# Patient Record
Sex: Female | Born: 1975 | Race: Black or African American | Hispanic: No | Marital: Married | Smoking: Never smoker
Health system: Southern US, Community
[De-identification: ages and names within clinical notes are randomized; demographics above are authoritative.]

## PROBLEM LIST (undated history)

## (undated) ENCOUNTER — Inpatient Hospital Stay (HOSPITAL_COMMUNITY): Payer: Self-pay

## (undated) DIAGNOSIS — D649 Anemia, unspecified: Secondary | ICD-10-CM

## (undated) DIAGNOSIS — O24419 Gestational diabetes mellitus in pregnancy, unspecified control: Secondary | ICD-10-CM

## (undated) DIAGNOSIS — N390 Urinary tract infection, site not specified: Secondary | ICD-10-CM

## (undated) HISTORY — DX: Anemia, unspecified: D64.9

## (undated) HISTORY — DX: Urinary tract infection, site not specified: N39.0

## (undated) HISTORY — DX: Gestational diabetes mellitus in pregnancy, unspecified control: O24.419

## (undated) HISTORY — PX: NO PAST SURGERIES: SHX2092

---

## 2007-02-08 ENCOUNTER — Emergency Department (HOSPITAL_COMMUNITY): Admission: EM | Admit: 2007-02-08 | Discharge: 2007-02-08 | Payer: Self-pay | Admitting: Emergency Medicine

## 2009-04-02 ENCOUNTER — Ambulatory Visit: Payer: Self-pay | Admitting: Obstetrics and Gynecology

## 2009-04-02 ENCOUNTER — Observation Stay (HOSPITAL_COMMUNITY): Admission: AD | Admit: 2009-04-02 | Discharge: 2009-04-03 | Payer: Self-pay | Admitting: Obstetrics and Gynecology

## 2009-04-15 ENCOUNTER — Encounter: Payer: Self-pay | Admitting: Infectious Disease

## 2009-04-15 ENCOUNTER — Ambulatory Visit: Payer: Self-pay | Admitting: Obstetrics and Gynecology

## 2009-04-15 ENCOUNTER — Encounter: Payer: Self-pay | Admitting: Advanced Practice Midwife

## 2009-04-29 ENCOUNTER — Encounter: Payer: Self-pay | Admitting: Advanced Practice Midwife

## 2009-04-29 ENCOUNTER — Encounter: Payer: Self-pay | Admitting: Infectious Diseases

## 2009-04-29 ENCOUNTER — Encounter: Payer: Self-pay | Admitting: Family

## 2009-04-29 ENCOUNTER — Ambulatory Visit: Payer: Self-pay | Admitting: Obstetrics and Gynecology

## 2009-04-29 LAB — CONVERTED CEMR LAB
Chlamydia, DNA Probe: NEGATIVE
HCT: 31.1 % — ABNORMAL LOW (ref 36.0–46.0)
MCV: 85.9 fL (ref 78.0–100.0)
Platelets: 305 10*3/uL (ref 150–400)
RBC: 3.62 M/uL — ABNORMAL LOW (ref 3.87–5.11)

## 2009-04-30 ENCOUNTER — Encounter: Payer: Self-pay | Admitting: Family

## 2009-05-04 ENCOUNTER — Ambulatory Visit: Payer: Self-pay | Admitting: Infectious Disease

## 2009-05-04 DIAGNOSIS — R5383 Other fatigue: Secondary | ICD-10-CM

## 2009-05-04 DIAGNOSIS — R42 Dizziness and giddiness: Secondary | ICD-10-CM

## 2009-05-04 DIAGNOSIS — R5381 Other malaise: Secondary | ICD-10-CM | POA: Insufficient documentation

## 2009-05-04 DIAGNOSIS — D509 Iron deficiency anemia, unspecified: Secondary | ICD-10-CM | POA: Insufficient documentation

## 2009-05-04 DIAGNOSIS — Z8613 Personal history of malaria: Secondary | ICD-10-CM

## 2009-05-04 DIAGNOSIS — Z9189 Other specified personal risk factors, not elsewhere classified: Secondary | ICD-10-CM | POA: Insufficient documentation

## 2009-05-04 LAB — CONVERTED CEMR LAB
Alkaline Phosphatase: 114 units/L (ref 39–117)
Basophils Absolute: 0 10*3/uL (ref 0.0–0.1)
Basophils Relative: 0 % (ref 0–1)
Bilirubin Urine: NEGATIVE
CMV IgM: <8 (ref <30.0)
CRP: 0.5 mg/dL (ref <0.6)
Calcium: 9 mg/dL (ref 8.4–10.5)
EBV VCA IgG: 4.43 — ABNORMAL HIGH
EBV VCA IgM: 0.26
Eosinophils Absolute: 0.1 10*3/uL (ref 0.0–0.7)
Eosinophils Relative: 1 % (ref 0–5)
Glucose, Bld: 100 mg/dL — ABNORMAL HIGH (ref 70–99)
HCV Ab: NEGATIVE
Hemoglobin: 9.9 g/dL — ABNORMAL LOW (ref 12.0–15.0)
Hep A Total Ab: POSITIVE — AB
Hep B Core Total Ab: POSITIVE — AB
Hep B S Ab: POSITIVE — AB
Hepatitis B Surface Ag: NEGATIVE
Lymphocytes Relative: 27 % (ref 12–46)
Nitrite: NEGATIVE
Potassium: 4 meq/L (ref 3.5–5.3)
RBC / HPF: NONE SEEN (ref <3)
RBC: 3.58 M/uL — ABNORMAL LOW (ref 3.87–5.11)
RDW: 14.2 % (ref 11.5–15.5)
Sed Rate: 85 mm/hr — ABNORMAL HIGH (ref 0–22)
Sodium: 135 meq/L (ref 135–145)
Total CK: 45 units/L (ref 7–177)
WBC: 6.9 10*3/uL (ref 4.0–10.5)
pH: 6.5 (ref 5.0–8.0)

## 2009-05-05 ENCOUNTER — Encounter: Payer: Self-pay | Admitting: Infectious Disease

## 2009-05-06 ENCOUNTER — Ambulatory Visit: Payer: Self-pay | Admitting: Obstetrics and Gynecology

## 2009-05-11 ENCOUNTER — Ambulatory Visit (HOSPITAL_COMMUNITY): Admission: RE | Admit: 2009-05-11 | Discharge: 2009-05-11 | Payer: Self-pay | Admitting: Obstetrics & Gynecology

## 2009-05-29 ENCOUNTER — Ambulatory Visit: Payer: Self-pay | Admitting: Advanced Practice Midwife

## 2009-05-29 ENCOUNTER — Inpatient Hospital Stay (HOSPITAL_COMMUNITY): Admission: AD | Admit: 2009-05-29 | Discharge: 2009-06-01 | Payer: Self-pay | Admitting: Obstetrics & Gynecology

## 2009-07-04 ENCOUNTER — Inpatient Hospital Stay (HOSPITAL_COMMUNITY): Admission: AD | Admit: 2009-07-04 | Discharge: 2009-07-04 | Payer: Self-pay | Admitting: Obstetrics & Gynecology

## 2009-07-04 ENCOUNTER — Ambulatory Visit: Payer: Self-pay | Admitting: Advanced Practice Midwife

## 2010-02-23 NOTE — Miscellaneous (Signed)
Summary: HIPAA Restrictions  HIPAA Restrictions   Imported By: Florinda Marker 05/05/2009 11:53:55  _____________________________________________________________________  External Attachment:    Type:   Image     Comment:   External Document

## 2010-02-23 NOTE — Assessment & Plan Note (Signed)
Summary: nw pt 36 wks prego malaria   Visit Type:  Consult Referring Provider:  Argentina Donovan Primary Provider:  Argentina Donovan  CC:  pain in bottom of stomach and radiates to the leg on the right side and dizzines.  History of Present Illness: Note ALL history was provided by the husband. [name] who is 36 weeks pregnan and [facility] by Dr. Okey Dupre. Patient was diagnosed with malaria 2006 and apparenly lost baby with which she was pregnant at theat time. She was treated with medicines without recurrence. From discussion today this appeared to have been the only episode but the notes from Dr Okey Dupre indicate she also had malaria in 1997. During this (her 7th) pregnancy the patient at times feels  weak and having difficulty moving. She feels like she is having a fever. She feels dizziness. She has back and stomach pain. No nausea or vomiting. She has had these symptoms for one week at least. When checked in Dr Erich Montane office she had no fever measured, her urine was clean and negative cultures taken, negative gc, chlamydiaphone [phone] (cell husband)  Past History:  Past Medical History: 6 pregnancies Malaria in 1997 and 2006 Iron Deficiency anemia  Past Surgical History: Female circumcision  Review of Systems       The patient complains of fever and abdominal pain.  The patient denies anorexia, weight loss, weight gain, vision loss, decreased hearing, hoarseness, chest pain, syncope, dyspnea on exertion, peripheral edema, prolonged cough, hemoptysis, melena, hematochezia, severe indigestion/heartburn, hematuria, incontinence, genital sores, muscle weakness, suspicious skin lesions, transient blindness, depression, unusual weight change, and abnormal bleeding.    Vital Signs:  Patient profile:   35 year old female Height:      63 inches (160.02 cm) Weight:      153.75 pounds (69.89 kg) BMI:     27.33 Temp:     98.3 degrees F (36.83 degrees C) oral Pulse  rate:   92 / minute Pulse (ortho):   105 / minute BP sitting:   108 / 74  (left arm) BP standing:   109 / 78  Vitals Entered By: Starleen Arms CMA (May 04, 2009 11:49 AM)  Serial Vital Signs/Assessments:  Time      Position  BP       Pulse  Resp  Temp     By 1:02 PM   Lying LA  101/71   98                    Tamika Yarborough CMA 1:02 PM   Sitting   108/75   97                    Tamika Yarborough CMA 1:02 PM   Standing  109/78   105                   Tamika Yarborough CMA  CC: pain in bottom of stomach and radiates to the leg on the right side, dizzines Is Patient Diabetic? No Pain Assessment Patient in pain? yes     Location: left side Intensity: 7 Type: aching Nutritional Status BMI of 25 - 29 = overweight Nutritional Status Detail nl  Does patient need assistance? Functional Status Self care Ambulation Normal   Physical Exam  General:  alert, well-nourished, and well-hydrated.   Head:  normocephalic, atraumatic, and no abnormalities observed.   Eyes:  vision grossly intact, pupils equal, and  pupils round.   Ears:  no external deformities.   Nose:  no external deformity and no external erythema.   Mouth:  pharynx pink and moist and no erythema.   Neck:  supple and full ROM.   Lungs:  normal respiratory effort, no crackles, and no wheezes.   Heart:  normal rate, regular rhythm, no murmur, no gallop, and no rub.   Abdomen:  soft.  gravid, slight pain in her lower quadrants which she showed me on the left side, positive bowel sounds Msk:  normal ROM.   Neurologic:  alert & oriented X3, strength normal in all extremities, and gait normal.   Skin:  no rashes on visible skin Cervical Nodes:  no anterior cervical adenopathy and no posterior cervical adenopathy.   Axillary Nodes:  no R axillary adenopathy and no L axillary adenopathy.   Psych:  Oriented X3, subdued, and withdrawn.     Impression & Recommendations:  Problem # 1:  FEVER, HX OF (ICD-V15.9) No fever  documented by thermometer at home. Not clear to me why she would have malaria now? I will check a malaria smear, but will work her up for other causes of fever including blood cultures x 2, repeat ua and cultures,  hiv ab rna, viral hepatides, cmv, ebv serologies, ana, esr, cpk, CRP, ACE level. She should measure her temperature at home to document fevers. She should be seen after delivering her baby. One complicating factor is that the pt does not want to be examined by a female MD and we have no female MDs, here other than Dr. Philipp Deputy and I do not think she is seeing non HIV pts in this clinic, though I can ask her if she can see this pt. Alternatively I could see the pt and not perform an exam or I could coordinate with her ob/gyn, or she could go to [place] or [place], [place] for female ID MD> Orders: T-Hepatitis A Antibody [phone]) T-Hepatitis B Core Antibody ([id number]) T-Hepatitis B Surface Antibody (30865-78469) T-Hepatitis B Surface Antigen [phone]) T-Epstein-Barr virus, viral capsid [phone]) T-Epstein-Barr virus, nuclear antigen [phone]) T-Epstein-Barr virus, early antigen (59563-87564) T-CK Total [phone]) T-Antinuclear Antib (ANA) 352 257 0450) T-C-Reactive Protein [phone]) T-Sed Rate (Automated) (20254-27062) T-HIV Viral Load (37628-31517) T-HIV Antibody  (Reflex) (61607-37106) T- * Misc. Laboratory test [phone]) T-Culture, Blood Routine [phone]) T-Culture, Blood Routine ([id number]) T-Urinalysis (16967-89381) T-Culture, Urine (01751-02585) Consultation Level IV (27782)  Problem # 2:  MALARIA, HX OF (ICD-V12.03) Doubt she has recurrence and curious why malaria smear was not already ordered at womens if this was of genuine concern at the time Orders: T-Malaria Smear (42353-61443) T-Comprehensive Metabolic Panel [id number]) T-CBC w/Diff [phone]) T-Hepatitis C Antibody (45809-98338) T-CMV IgG Antibody (25053-97673) T-CMV IgM  Antibody  ([address]) Consultation Level IV (09735)  Problem # 3:  IRON DEFICIENCY (ICD-280.9)  cbc being checked again  Orders: Consultation Level IV (32992)  Problem # 4:  DIZZINESS (ICD-780.4)  likely due to volume distribution in pregnancy plus anemia possibly, Check orthostatics  Orders: Consultation Level IV (42683)  Patient Instructions: 1)  We will place a PPD today 2)  Please return to clinic in 2 days for reading of this test 3)  We will do other blood work today 4)  Please rtc in one month

## 2010-02-23 NOTE — Consult Note (Signed)
Summary: [facility]: [facility]  [facility]   Imported By: Florinda Marker 05/11/2009 15:55:14  _____________________________________________________________________  External Attachment:    Type:   Image     Comment:   External Document

## 2010-04-13 LAB — GLUCOSE, CAPILLARY
Glucose-Capillary: 77 mg/dL (ref 70–99)
Glucose-Capillary: 90 mg/dL (ref 70–99)

## 2010-04-13 LAB — CBC
Hemoglobin: 9.9 g/dL — ABNORMAL LOW (ref 12.0–15.0)
Platelets: 233 10*3/uL (ref 150–400)

## 2010-04-13 LAB — RPR: RPR Ser Ql: NONREACTIVE

## 2010-04-14 LAB — POCT URINALYSIS DIP (DEVICE)
Glucose, UA: NEGATIVE mg/dL
Hgb urine dipstick: NEGATIVE
Protein, ur: NEGATIVE mg/dL
Specific Gravity, Urine: >=1.03 (ref 1.005–1.030)

## 2010-04-19 LAB — POCT URINALYSIS DIP (DEVICE)
Bilirubin Urine: NEGATIVE
Hgb urine dipstick: NEGATIVE
Ketones, ur: NEGATIVE mg/dL
Protein, ur: NEGATIVE mg/dL
pH: 5.5 (ref 5.0–8.0)

## 2010-04-19 LAB — DIFFERENTIAL
Basophils Absolute: 0.1 10*3/uL (ref 0.0–0.1)
Eosinophils Relative: 1 % (ref 0–5)
Lymphs Abs: 2.2 10*3/uL (ref 0.7–4.0)
Monocytes Relative: 4 % (ref 3–12)
Neutrophils Relative %: 66 % (ref 43–77)

## 2010-04-19 LAB — TYPE AND SCREEN: ABO/RH(D): O POS

## 2010-04-19 LAB — CBC
MCHC: 32.7 g/dL (ref 30.0–36.0)
MCV: 88.8 fL (ref 78.0–100.0)
RBC: 3.58 MIL/uL — ABNORMAL LOW (ref 3.87–5.11)
RDW: 13.9 % (ref 11.5–15.5)
WBC: 8 10*3/uL (ref 4.0–10.5)

## 2010-04-19 LAB — GC/CHLAMYDIA PROBE AMP, GENITAL: Chlamydia, DNA Probe: NEGATIVE

## 2010-04-19 LAB — WET PREP, GENITAL: Clue Cells Wet Prep HPF POC: NONE SEEN

## 2010-04-19 LAB — ABO/RH: ABO/RH(D): O POS

## 2010-04-19 LAB — SICKLE CELL SCREEN: Sickle Cell Screen: NEGATIVE

## 2010-10-15 LAB — CBC
Hemoglobin: 11.9 — ABNORMAL LOW
MCHC: 34.4
RBC: 4.21
WBC: 7.5

## 2010-10-15 LAB — URINALYSIS, ROUTINE W REFLEX MICROSCOPIC
Bilirubin Urine: NEGATIVE
Glucose, UA: NEGATIVE
Hgb urine dipstick: NEGATIVE
Specific Gravity, Urine: 1.013
pH: 5.5

## 2010-10-15 LAB — COMPREHENSIVE METABOLIC PANEL
ALT: 89 — ABNORMAL HIGH
AST: 50 — ABNORMAL HIGH
Alkaline Phosphatase: 120 — ABNORMAL HIGH
CO2: 29
Calcium: 9.8
GFR calc Af Amer: >60
GFR calc non Af Amer: >60
Glucose, Bld: 93
Potassium: 4.3
Sodium: 139

## 2010-10-15 LAB — DIFFERENTIAL
Basophils Relative: 0
Eosinophils Absolute: 0
Eosinophils Relative: 1
Lymphs Abs: 2.1
Monocytes Relative: 4

## 2010-10-15 LAB — LIPASE, BLOOD: Lipase: 32

## 2010-10-15 LAB — URINE MICROSCOPIC-ADD ON

## 2011-10-05 IMAGING — US US OB COMP +14 WK
1 series · 14 of 28 positions shown · non-contrast
Comparison: none

OBSTETRICAL ULTRASOUND:
 This ultrasound exam was performed in the [HOSPITAL] Ultrasound Department.  The OB US report was generated in the AS system, and faxed to the ordering physician.  This report is also available in [HOSPITAL]?s Access[employer] and in [REDACTED] PACS.

[Series 1: us ob comp +14 wk · 0.30mm/px · 54 acquisitions, 14 frames shown]
[im 2/54]
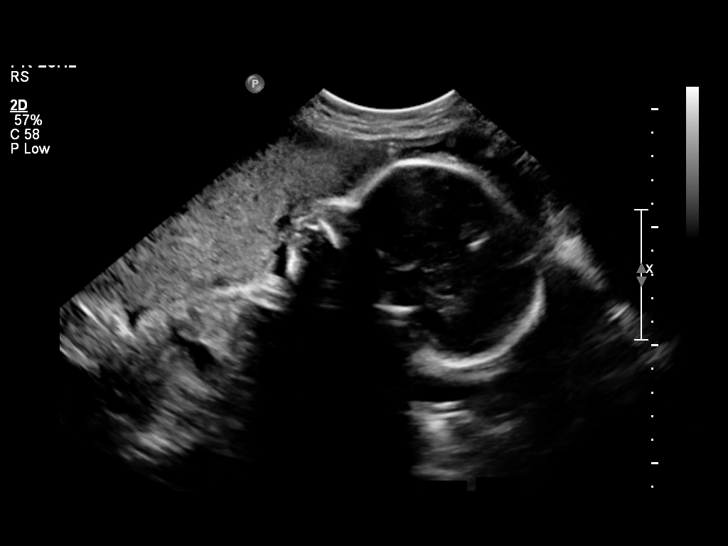
[im 6/54]
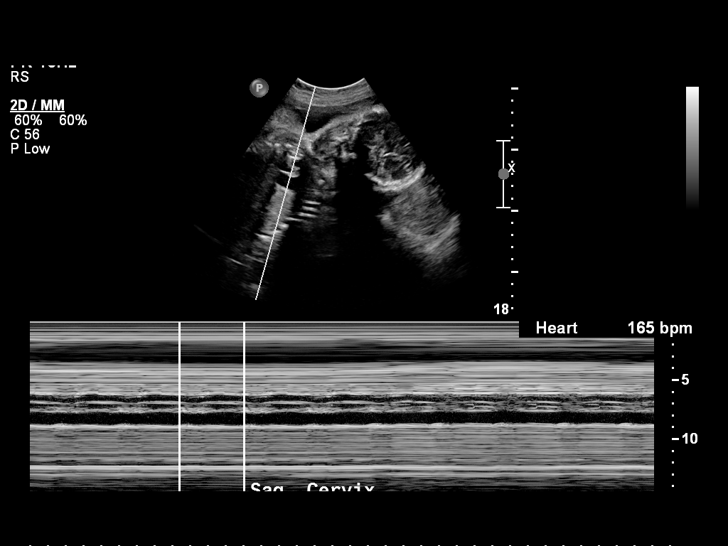
[im 10/54]
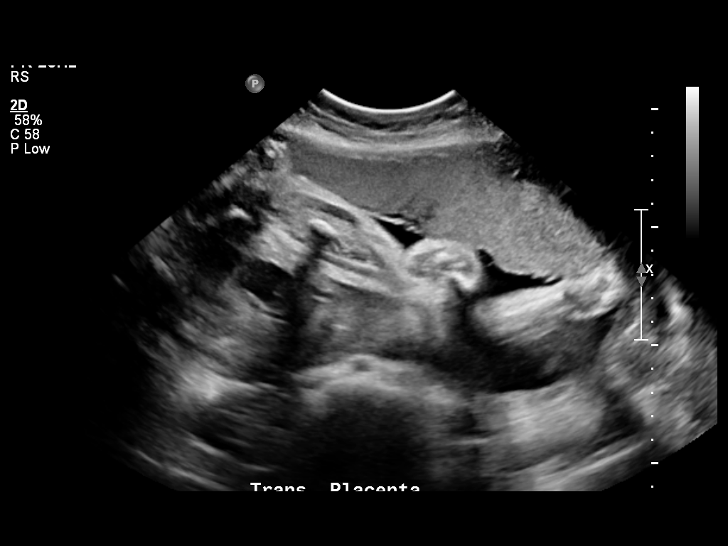
[im 14/54]
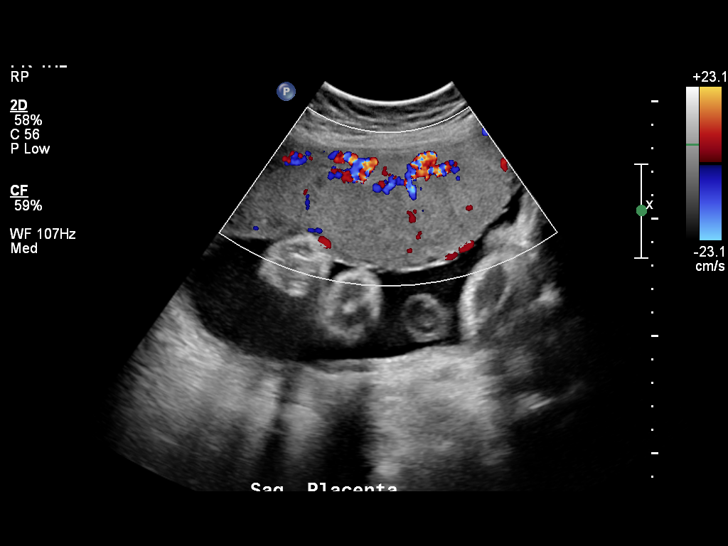
[im 18/54]
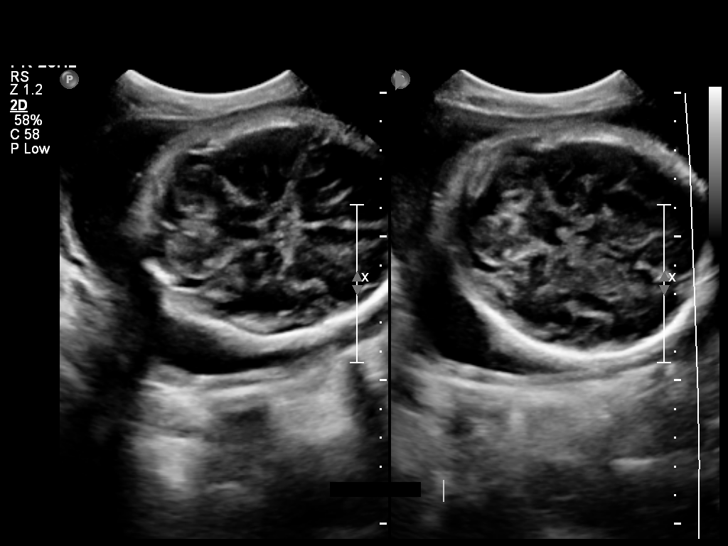
[im 22/54]
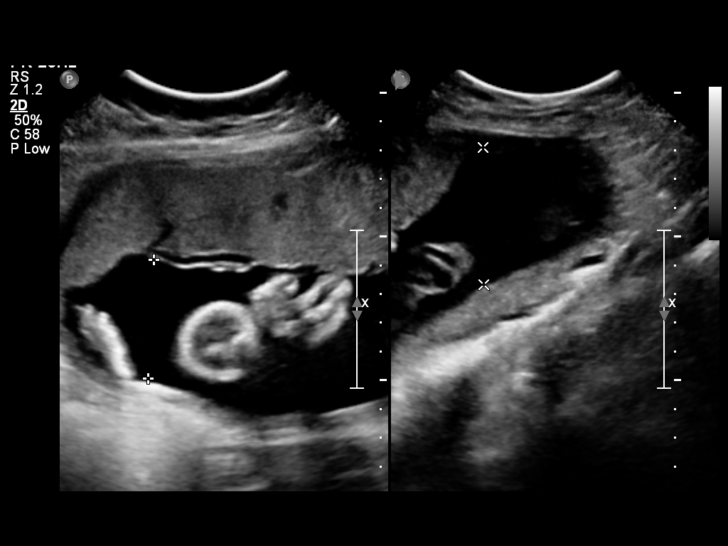
[im 26/54]
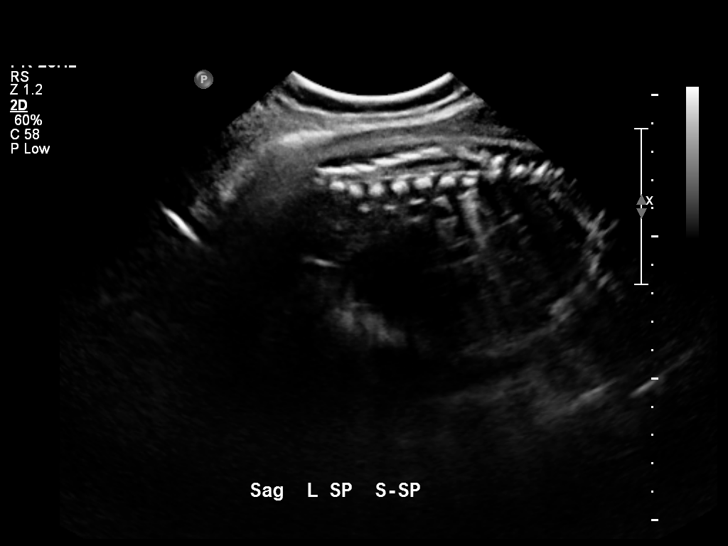
[im 30/54]
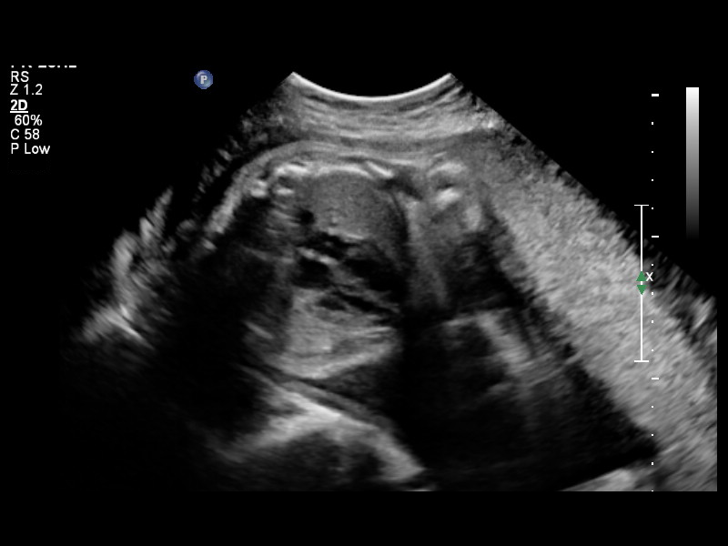
[im 34/54]
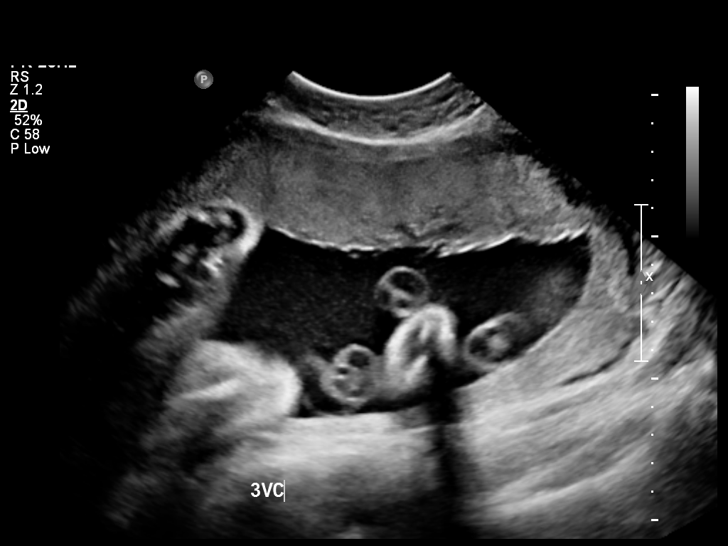
[im 38/54]
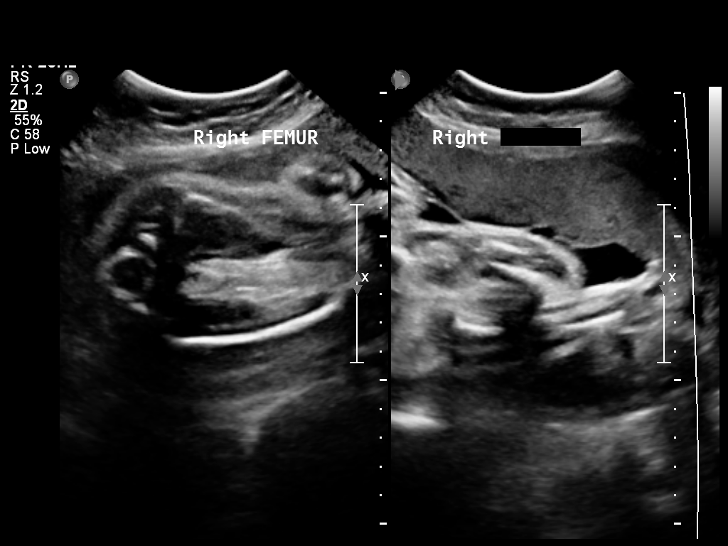
[im 42/54]
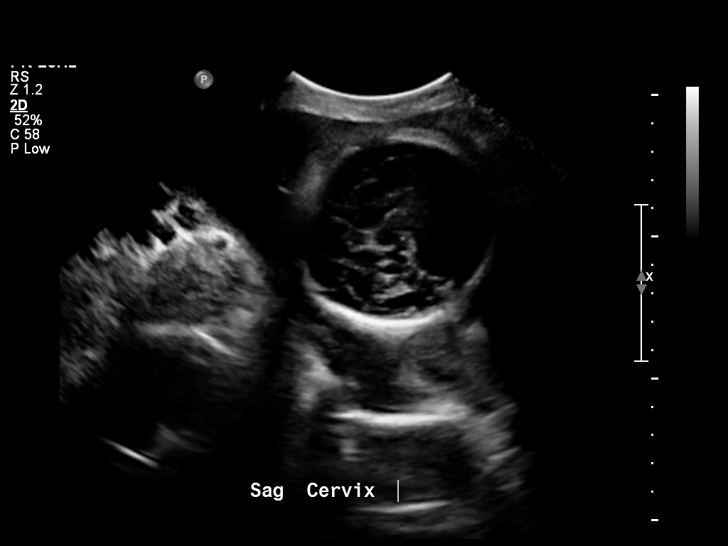
[im 46/54]
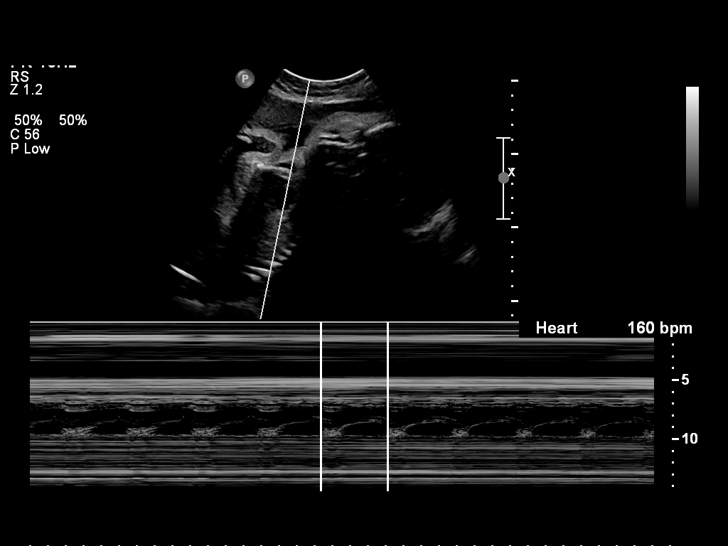
[im 50/54]
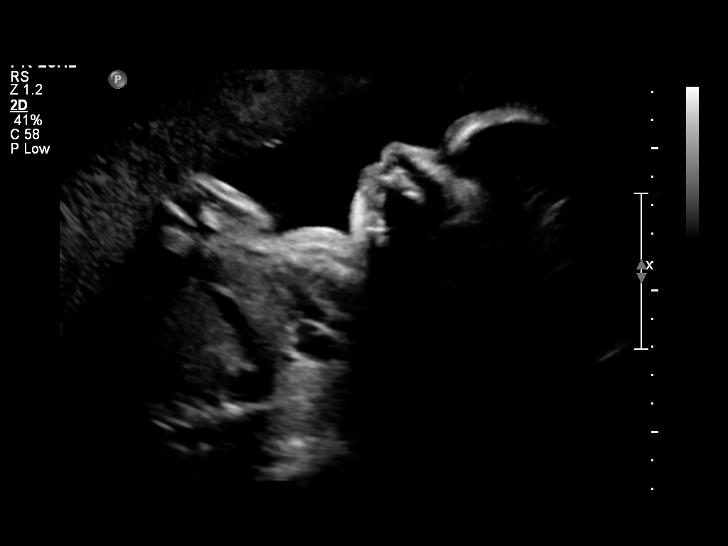
[im 54/54]
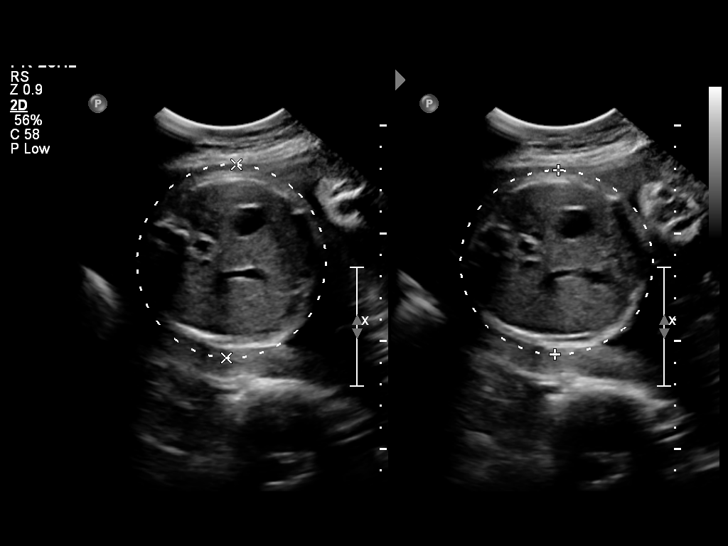

[14 of 28 positions shown; findings below may reference images not displayed]

IMPRESSION: See AS Obstetric US report.

## 2014-01-19 ENCOUNTER — Encounter (HOSPITAL_COMMUNITY): Payer: Self-pay | Admitting: *Deleted

## 2014-01-19 ENCOUNTER — Inpatient Hospital Stay (HOSPITAL_COMMUNITY)
Admission: AD | Admit: 2014-01-19 | Discharge: 2014-01-19 | Disposition: A | Discharge status: Home / Self Care | Payer: No Typology Code available for payment source | Source: Ambulatory Visit | Attending: Obstetrics and Gynecology | Admitting: Obstetrics and Gynecology

## 2014-01-19 DIAGNOSIS — O9989 Other specified diseases and conditions complicating pregnancy, childbirth and the puerperium: Secondary | ICD-10-CM | POA: Diagnosis not present

## 2014-01-19 DIAGNOSIS — S76011A Strain of muscle, fascia and tendon of right hip, initial encounter: Secondary | ICD-10-CM | POA: Diagnosis not present

## 2014-01-19 DIAGNOSIS — M545 Low back pain: Secondary | ICD-10-CM | POA: Diagnosis not present

## 2014-01-19 DIAGNOSIS — W182XXA Fall in (into) shower or empty bathtub, initial encounter: Secondary | ICD-10-CM | POA: Diagnosis not present

## 2014-01-19 DIAGNOSIS — R109 Unspecified abdominal pain: Secondary | ICD-10-CM | POA: Insufficient documentation

## 2014-01-19 LAB — URINALYSIS, ROUTINE W REFLEX MICROSCOPIC
Bilirubin Urine: NEGATIVE
Glucose, UA: NEGATIVE mg/dL
HGB URINE DIPSTICK: NEGATIVE
Ketones, ur: NEGATIVE mg/dL
NITRITE: NEGATIVE
Protein, ur: NEGATIVE mg/dL
SPECIFIC GRAVITY, URINE: 1.02 (ref 1.005–1.030)
UROBILINOGEN UA: 0.2 mg/dL (ref 0.0–1.0)
pH: 5.5 (ref 5.0–8.0)

## 2014-01-19 LAB — POCT PREGNANCY, URINE: PREG TEST UR: NEGATIVE

## 2014-01-19 LAB — URINE MICROSCOPIC-ADD ON

## 2014-01-19 MED ORDER — CYCLOBENZAPRINE HCL 10 MG PO TABS
10.0000 mg | ORAL_TABLET | Freq: Once | ORAL | Status: AC
  Administered 2014-01-19: 10 mg via ORAL
  Filled 2014-01-19: qty 1

## 2014-01-19 MED ORDER — CYCLOBENZAPRINE HCL 10 MG PO TABS: 10.0000 mg | ORAL_TABLET | Freq: Three times a day (TID) | ORAL | Status: DC | PRN

## 2014-01-19 NOTE — MAU Note (Addendum)
Pt states that she almost fell in the shower 4 days ago around noon. Pt was not having pain until this time. She slipped, caught herself and did not fall. Since this time she c/o lower abdominal and lower back pain. Denies bleeding.

## 2014-01-19 NOTE — Discharge Instructions (Signed)

## 2014-01-19 NOTE — MAU Provider Note (Signed)
History     CSN: [id number]  Arrival date and time: 01/19/14 1949   First Provider Initiated Contact with Patient 01/19/14 2028      Chief Complaint  Patient presents with  . Abdominal Pain   HPI  Michelle Scott is a 38 y.o. G7P7 who presents today with abdominal and back pain. She states that four days ago she slipped in the shower. She states that she did not fall or injury herself. However, the motion that she used to catch herself has lead to back and abdominal/hip pain. She states that she has taken ibuprofen, but it has not helped. She denies any nausea, vomiting, fever or urinary sx. She states that she was not dizzy when she fell, and that she fell because she had soap on her feet.   Patient was offered telephone interpreter. She declines telephone interpreter. She would like to have her husband interpret for her  History reviewed. No pertinent past medical history.  Past Surgical History  Procedure Laterality Date  . No past surgeries      No family history on file.  History  Substance Use Topics  . Smoking status: Never Smoker   . Smokeless tobacco: Never Used  . Alcohol Use: No    Allergies: No Known Allergies  Prescriptions prior to admission  Medication Sig Dispense Refill Last Dose  . ibuprofen (ADVIL,MOTRIN) 800 MG tablet Take 800 mg by mouth.       ROS Physical Exam   Blood pressure 125/69, pulse 81, temperature 98.8 F (37.1 C), temperature source Oral, resp. rate 18, height 5\' 7"  (1.702 m), weight 74.39 kg (164 lb), last menstrual period 01/05/2014.  Physical Exam  Nursing note and vitals reviewed. Constitutional: She is oriented to person, place, and time. She appears well-developed and well-nourished. No distress.  Cardiovascular: Normal rate.   Respiratory: Effort normal.  GI: Soft. There is no tenderness. There is no rebound.  Musculoskeletal:  Tnederness along the right iliac crest and the right edge of the sacrum. No visible bruises or  signs or injury.   Neurological: She is alert and oriented to person, place, and time.  Skin: Skin is warm and dry.  Psychiatric: She has a normal mood and affect.    MAU Course  Procedures  Results for orders placed or performed during the hospital encounter of 01/19/14 (from the past 24 hour(s))  Urinalysis, Routine w reflex microscopic     Status: Abnormal   Collection Time: 01/19/14  8:00 PM  Result Value Ref Range   Color, Urine YELLOW YELLOW   APPearance CLEAR CLEAR   Specific Gravity, Urine 1.020 1.005 - 1.030   pH 5.5 5.0 - 8.0   Glucose, UA NEGATIVE NEGATIVE mg/dL   Hgb urine dipstick NEGATIVE NEGATIVE   Bilirubin Urine NEGATIVE NEGATIVE   Ketones, ur NEGATIVE NEGATIVE mg/dL   Protein, ur NEGATIVE NEGATIVE mg/dL   Urobilinogen, UA 0.2 0.0 - 1.0 mg/dL   Nitrite NEGATIVE NEGATIVE   Leukocytes, UA SMALL (A) NEGATIVE  Urine microscopic-add on     Status: Abnormal   Collection Time: 01/19/14  8:00 PM  Result Value Ref Range   Squamous Epithelial / LPF MANY (A) RARE   WBC, UA 3-6 <3 WBC/hpf   RBC / HPF 0-2 <3 RBC/hpf   Bacteria, UA FEW (A) RARE  Pregnancy, urine POC     Status: None   Collection Time: 01/19/14  8:11 PM  Result Value Ref Range   Preg Test, Ur NEGATIVE NEGATIVE  2136: Patient states that her pain has improved with flexeril.   Assessment and Plan   1. Strain of muscle of right hip, initial encounter    Comfort measures reviewed FU with [facility] if pain worsens    Medication List    TAKE these medications        cyclobenzaprine 10 MG tablet  Commonly known as:  FLEXERIL  Take 1 tablet (10 mg total) by mouth 3 (three) times daily as needed for muscle spasms.     ibuprofen 800 MG tablet  Commonly known as:  ADVIL,MOTRIN  Take 800 mg by mouth.       Follow-up Information    Follow up with [facility].   Why:  If symptoms worsen   Contact information:   [address] [facility] [place] [phone]        Tawnya CrookHogan, Heather  Donovan 01/19/2014, 8:44 PM

## 2014-01-21 LAB — URINE CULTURE
Colony Count: NO GROWTH
Culture: NO GROWTH

## 2015-10-15 ENCOUNTER — Inpatient Hospital Stay (HOSPITAL_COMMUNITY)
Admission: AD | Admit: 2015-10-15 | Discharge: 2015-10-15 | Disposition: A | Discharge status: Home / Self Care | Payer: Medicaid Other | Source: Ambulatory Visit | Attending: Obstetrics & Gynecology | Admitting: Obstetrics & Gynecology

## 2015-10-15 ENCOUNTER — Inpatient Hospital Stay (HOSPITAL_COMMUNITY): Payer: Medicaid Other

## 2015-10-15 ENCOUNTER — Encounter (HOSPITAL_COMMUNITY): Payer: Self-pay | Admitting: *Deleted

## 2015-10-15 DIAGNOSIS — O09529 Supervision of elderly multigravida, unspecified trimester: Secondary | ICD-10-CM

## 2015-10-15 DIAGNOSIS — O0990 Supervision of high risk pregnancy, unspecified, unspecified trimester: Secondary | ICD-10-CM

## 2015-10-15 DIAGNOSIS — Z789 Other specified health status: Secondary | ICD-10-CM | POA: Diagnosis present

## 2015-10-15 DIAGNOSIS — Z3A01 Less than 8 weeks gestation of pregnancy: Secondary | ICD-10-CM | POA: Diagnosis not present

## 2015-10-15 DIAGNOSIS — O9989 Other specified diseases and conditions complicating pregnancy, childbirth and the puerperium: Secondary | ICD-10-CM | POA: Diagnosis not present

## 2015-10-15 DIAGNOSIS — O26899 Other specified pregnancy related conditions, unspecified trimester: Secondary | ICD-10-CM

## 2015-10-15 DIAGNOSIS — R109 Unspecified abdominal pain: Secondary | ICD-10-CM | POA: Diagnosis present

## 2015-10-15 DIAGNOSIS — Z758 Other problems related to medical facilities and other health care: Secondary | ICD-10-CM | POA: Diagnosis present

## 2015-10-15 DIAGNOSIS — O26891 Other specified pregnancy related conditions, first trimester: Secondary | ICD-10-CM | POA: Insufficient documentation

## 2015-10-15 DIAGNOSIS — N90810 Female genital mutilation status, unspecified: Secondary | ICD-10-CM | POA: Diagnosis present

## 2015-10-15 LAB — WET PREP, GENITAL
Clue Cells Wet Prep HPF POC: NONE SEEN
Sperm: NONE SEEN
TRICH WET PREP: NONE SEEN
YEAST WET PREP: NONE SEEN

## 2015-10-15 LAB — CBC
HCT: 32 % — ABNORMAL LOW (ref 36.0–46.0)
Hemoglobin: 11 g/dL — ABNORMAL LOW (ref 12.0–15.0)
MCH: 28.5 pg (ref 26.0–34.0)
MCHC: 34.4 g/dL (ref 30.0–36.0)
MCV: 82.9 fL (ref 78.0–100.0)
PLATELETS: 269 10*3/uL (ref 150–400)
RBC: 3.86 MIL/uL — ABNORMAL LOW (ref 3.87–5.11)
RDW: 13.8 % (ref 11.5–15.5)
WBC: 6.4 10*3/uL (ref 4.0–10.5)

## 2015-10-15 LAB — URINALYSIS, ROUTINE W REFLEX MICROSCOPIC
BILIRUBIN URINE: NEGATIVE
Glucose, UA: NEGATIVE mg/dL
HGB URINE DIPSTICK: NEGATIVE
Ketones, ur: NEGATIVE mg/dL
Leukocytes, UA: NEGATIVE
Nitrite: NEGATIVE
PH: 5.5 (ref 5.0–8.0)
Protein, ur: NEGATIVE mg/dL
SPECIFIC GRAVITY, URINE: 1.01 (ref 1.005–1.030)

## 2015-10-15 LAB — GC/CHLAMYDIA PROBE AMP (~~LOC~~) NOT AT ARMC
Chlamydia: NEGATIVE
Neisseria Gonorrhea: NEGATIVE

## 2015-10-15 LAB — HCG, QUANTITATIVE, PREGNANCY: HCG, BETA CHAIN, QUANT, S: 57638 m[IU]/mL — AB (ref <5)

## 2015-10-15 LAB — POCT PREGNANCY, URINE: PREG TEST UR: POSITIVE — AB

## 2015-10-15 LAB — HIV ANTIBODY (ROUTINE TESTING W REFLEX): HIV SCREEN 4TH GENERATION: NONREACTIVE

## 2015-10-15 MED ORDER — OXYCODONE-ACETAMINOPHEN 5-325 MG PO TABS
1.0000 | ORAL_TABLET | Freq: Once | ORAL | Status: AC
  Administered 2015-10-15: 1 via ORAL
  Filled 2015-10-15: qty 1

## 2015-10-15 MED ORDER — CONCEPT OB 130-92.4-1 MG PO CAPS: 1.0000 | ORAL_CAPSULE | Freq: Every day | ORAL | 12 refills | Status: DC

## 2015-10-15 MED ORDER — TRAMADOL HCL 50 MG PO TABS: 50.0000 mg | ORAL_TABLET | Freq: Four times a day (QID) | ORAL | 0 refills | Status: DC | PRN

## 2015-10-15 NOTE — Discharge Instructions (Signed)

## 2015-10-15 NOTE — MAU Provider Note (Signed)
Chief Complaint: Abdominal Cramping   First Provider Initiated Contact with Patient 10/15/15 0428     SUBJECTIVE HPI: Michelle Scott is a 40 y.o. G8P7 at 5731w5d who presents to [facility] reporting sharp low abd pain that woke her from her sleep. Pos UPT. No other testing this pregnancy.   Location: suprapubic  Quality: sharp Severity: severe Duration: <1 hour Course: unchanged Timing: constant Modifying factors: none. Hasn't tried anything for the pain.  Associated signs and symptoms: Neg for fever, chills, VB, vaginal discharge, urinary complaints. Having N/V x ~1 week. No change.   Pt has signed a form declining hospital interpreter. Husband interpreting.   History reviewed. No pertinent past medical history. OB History  Gravida Para Term Preterm AB Living  8 7       6   SAB TAB Ectopic Multiple Live Births  0 0 0   6    # Outcome Date GA Lbr Len/2nd Weight Sex Delivery Anes PTL Lv  8 Current           7 Para      Vag-Spont     6 Para      Vag-Spont     5 Para      Vag-Spont     4 Para      Vag-Spont     3 Para      Vag-Spont     2 Para      Vag-Spont     1 Para      Vag-Spont        Past Surgical History:  Procedure Laterality Date  . NO PAST SURGERIES     Social History   Social History  . Marital status: Married    Spouse name: N/A  . Number of children: N/A  . Years of education: N/A   Occupational History  . Not on file.   Social History Main Topics  . Smoking status: Never Smoker  . Smokeless tobacco: Never Used  . Alcohol use No  . Drug use: No  . Sexual activity: Yes    Birth control/ protection: None   Other Topics Concern  . Not on file   Social History Narrative  . No narrative on file   No current facility-administered medications on file prior to encounter.    Current Outpatient Prescriptions on File Prior to Encounter  Medication Sig Dispense Refill  . cyclobenzaprine (FLEXERIL) 10 MG tablet Take 1 tablet (10 mg total) by  mouth 3 (three) times daily as needed for muscle spasms. 30 tablet 0  . ibuprofen (ADVIL,MOTRIN) 800 MG tablet Take 800 mg by mouth.     No Known Allergies  I have reviewed the past Medical Hx, Surgical Hx, Social Hx, Allergies and Medications.   Review of Systems  Constitutional: Negative for chills and fever.  Gastrointestinal: Positive for abdominal pain, nausea and vomiting. Negative for constipation and diarrhea.  Genitourinary: Negative for dysuria, hematuria, vaginal bleeding and vaginal discharge.  Musculoskeletal: Negative for back pain.    OBJECTIVE Patient Vitals for the past 24 hrs:  BP Temp Temp src Pulse Resp  10/15/15 0604 104/63 - - 65 -  10/15/15 0349 116/74 97.6 F (36.4 C) Oral 73 20   Constitutional: Well-developed, well-nourished female in mild distress.  Cardiovascular: normal rate Respiratory: normal rate and effort.  GI: Abd soft, moderate SP tenderness. Pos guarding. Neg mass or rebound tenderness. Pos BS x 4 MS: Extremities nontender, no edema, normal ROM Neurologic: Alert and oriented x 4.  GU: Neg CVAT.  SPECULUM EXAM: Complete infibulation, physiologic discharge, no blood noted, cervix clean  BIMANUAL: cervix closed; uterus top- normal size, no adnexal tenderness or masses. No CMT.  LAB RESULTS Results for orders placed or performed during the hospital encounter of 10/15/15 (from the past 24 hour(s))  Urinalysis, Routine w reflex microscopic (not at [facility])     Status: None   Collection Time: 10/15/15  3:55 AM  Result Value Ref Range   Color, Urine YELLOW YELLOW   APPearance CLEAR CLEAR   Specific Gravity, Urine 1.010 1.005 - 1.030   pH 5.5 5.0 - 8.0   Glucose, UA NEGATIVE NEGATIVE mg/dL   Hgb urine dipstick NEGATIVE NEGATIVE   Bilirubin Urine NEGATIVE NEGATIVE   Ketones, ur NEGATIVE NEGATIVE mg/dL   Protein, ur NEGATIVE NEGATIVE mg/dL   Nitrite NEGATIVE NEGATIVE   Leukocytes, UA NEGATIVE NEGATIVE  Pregnancy, urine POC     Status: Abnormal    Collection Time: 10/15/15  3:57 AM  Result Value Ref Range   Preg Test, Ur POSITIVE (A) NEGATIVE  hCG, quantitative, pregnancy     Status: Abnormal   Collection Time: 10/15/15  4:18 AM  Result Value Ref Range   hCG, Beta Chain, Quant, S 57,638 (H) <5 mIU/mL  CBC     Status: Abnormal   Collection Time: 10/15/15  4:18 AM  Result Value Ref Range   WBC 6.4 4.0 - 10.5 K/uL   RBC 3.86 (L) 3.87 - 5.11 MIL/uL   Hemoglobin 11.0 (L) 12.0 - 15.0 g/dL   HCT 16.1 (L) 09.6 - 04.5 %   MCV 82.9 78.0 - 100.0 fL   MCH 28.5 26.0 - 34.0 pg   MCHC 34.4 30.0 - 36.0 g/dL   RDW 40.9 81.1 - 91.4 %   Platelets 269 150 - 400 K/uL  Wet prep, genital     Status: Abnormal   Collection Time: 10/15/15  4:30 AM  Result Value Ref Range   Yeast Wet Prep HPF POC NONE SEEN NONE SEEN   Trich, Wet Prep NONE SEEN NONE SEEN   Clue Cells Wet Prep HPF POC NONE SEEN NONE SEEN   WBC, Wet Prep HPF POC FEW (A) NONE SEEN   Sperm NONE SEEN     IMAGING US Ob Comp Less 14 Wks  Result Date: 10/15/2015 CLINICAL DATA:  Abdominal pain for 10 days. Estimated gestational age by LMP is 6 weeks 5 days. Quantitative beta HCG is pending. EXAM: OBSTETRIC <14 WK ULTRASOUND TECHNIQUE: Transabdominal ultrasound was performed for evaluation of the gestation as well as the maternal uterus and adnexal regions. COMPARISON:  None. FINDINGS: Intrauterine gestational sac: A single intrauterine gestational sac is present. Yolk sac:  Yolk sac is present. Embryo:  Fetal pole is present. Cardiac Activity: Fetal cardiac activity is observed. Heart Rate: 138 bpm CRL:   9.5  mm   6 w 6 d                  [place] EDC: 06/03/2016 Subchorionic hemorrhage:  None visualized. Maternal uterus/adnexae: Uterus is anteverted. No myometrial mass lesions demonstrated. Both ovaries are visualized and appear normal. Benign-appearing cysts on the left ovary, largest measuring 2.4 cm maximal diameter, likely functional. Small amount of free fluid in the pelvis is likely physiologic.  IMPRESSION: Single intrauterine pregnancy. Estimated gestational age by crown-rump length is 6 weeks 6 days. No acute complication demonstrated sonographically. Small amount of free fluid in the pelvis, likely physiologic. Electronically Signed   By: Chrissie Noa  Andria Meuse M.D.   On: 10/15/2015 05:21    MAU COURSE CBC, Quant, ABO/Rh, ultrasound, wet prep and GC/chlamydia culture, UA, Percocet.   MDM Pain in early pregnancy with normal intrauterine pregnancy and hemodynamically stable. Pain possibly due to ruptured cyst.   ASSESSMENT 1. Abdominal pain affecting pregnancy     PLAN Discharge home in stable condition. First trimester precautions Follow-up Information    Obstetrician of your choice .   Why:  Start prenatal care       [facility] [place] [facility] AD[facility] .   Why:  as needed in emergencies Contact information: [address] [id number] mc [place] [address] [phone]           Medication List    STOP taking these medications   cyclobenzaprine 10 MG tablet Commonly known as:  FLEXERIL   ibuprofen 800 MG tablet Commonly known as:  ADVIL,MOTRIN     TAKE these medications   CONCEPT OB 130-92.4-1 MG Caps Take 1 tablet by mouth daily.   traMADol 50 MG tablet Commonly known as:  ULTRAM Take 1-2 tablets (50-100 mg total) by mouth every 6 (six) hours as needed for severe pain.        [place], CNM 10/15/2015  5:53 AM  4

## 2015-10-15 NOTE — MAU Note (Addendum)
PT HAS ARRIVED VIA EMS.   HUSBAND  - ROBDU  SAYS ON 9-11-  THEY  WENT  TO DR   FOR ABD  PAIN-   UPT  WAS POSITIVE    .     SAYS  SHE WAS ASLEEP  - WOKE  AT 0230-   TO B-ROOM  - SHE  VOMITED  THEN ABD PAIN  BECAME  PAIN  WORSE

## 2015-11-05 LAB — OB RESULTS CONSOLE HEPATITIS B SURFACE ANTIGEN: Hepatitis B Surface Ag: NEGATIVE

## 2015-11-05 LAB — CYTOLOGY - PAP
CYSTIC FIBROSIS PROFILE: NEGATIVE
Drug Screen, Urine: NEGATIVE
GLUCOSE 1 HR PRENATAL, POC: 131 mg/dL
Pap: NEGATIVE
QUAD SCREEN FOR MFM: ABNORMAL
Urine Culture, OB: NEGATIVE

## 2015-11-05 LAB — OB RESULTS CONSOLE ABO/RH: RH TYPE: POSITIVE

## 2015-11-05 LAB — OB RESULTS CONSOLE PLATELET COUNT: Platelets: 287 10*3/uL

## 2015-11-05 LAB — OB RESULTS CONSOLE RPR: RPR: NONREACTIVE

## 2015-11-05 LAB — OB RESULTS CONSOLE GC/CHLAMYDIA
Chlamydia: NEGATIVE
GC PROBE AMP, GENITAL: NEGATIVE

## 2015-11-05 LAB — OB RESULTS CONSOLE RUBELLA ANTIBODY, IGM: RUBELLA: IMMUNE

## 2015-11-05 LAB — OB RESULTS CONSOLE HGB/HCT, BLOOD
HCT: 36 %
Hemoglobin: 11.6 g/dL

## 2015-11-05 LAB — OB RESULTS CONSOLE ANTIBODY SCREEN: Antibody Screen: NEGATIVE

## 2015-11-05 LAB — OB RESULTS CONSOLE HIV ANTIBODY (ROUTINE TESTING): HIV: NONREACTIVE

## 2015-11-05 LAB — OB RESULTS CONSOLE VARICELLA ZOSTER ANTIBODY, IGG: VARICELLA IGG: IMMUNE

## 2016-01-25 NOTE — L&D Delivery Note (Signed)
Delivery Note At 7:25 AM a viable female was delivered via Vaginal, Spontaneous Delivery (Presentation: Direct OT).  APGAR: 9, 9; weight  pending.   Placenta status: Spontaneous, intact.  Cord: 3 vessels. Infant dried and placed on pt's abd. Cord clamped and cut by SNM; hospital cord blood sample collected.  Episiotomy of infundibulum done to make room for delivery because of previous female circumcision. Patient requested repair. Repaired with 3 interrupted stitches.  Anesthesia:  1% licodaine Episiotomy: None Lacerations: None Suture Repair: 3.0 monocryl Est. Blood Loss (mL): 100  Mom to postpartum.  Baby to Couplet care / Skin to Skin.  Cleone Slim SNM 05/24/2016, 7:47 AM  Patient is a [id number] at [redacted]w[redacted]d who was admitted in active labor, significant hx of GDMA2, AMA, grandmultip.  She progressed without augmentation.  I was gloved and present for delivery in its entirety.  Second stage of labor progressed, baby delivered after a couple contractions, once she felt the urge to push.  No decels during second stage noted.  Complications: none  Lacerations: epis of infundibulum prior to delivery  EBL: 100cc  Cam Hai, CNM 9:07 AM 05/24/2016

## 2016-03-14 LAB — OB RESULTS CONSOLE HIV ANTIBODY (ROUTINE TESTING): HIV: NONREACTIVE

## 2016-03-14 LAB — OB RESULTS CONSOLE HGB/HCT, BLOOD
HCT: 29 %
Hemoglobin: 9.5 g/dL

## 2016-03-14 LAB — GLUCOSE, 1 HOUR: GLUCOSE 1 HR PRENATAL, POC: 142 mg/dL

## 2016-03-14 LAB — OB RESULTS CONSOLE RPR: RPR: NONREACTIVE

## 2016-03-18 ENCOUNTER — Encounter (HOSPITAL_COMMUNITY): Payer: Self-pay

## 2016-03-23 LAB — GLUCOSE, 3 HOUR

## 2016-03-31 ENCOUNTER — Encounter: Payer: Self-pay | Admitting: *Deleted

## 2016-04-01 ENCOUNTER — Encounter: Payer: Self-pay | Admitting: *Deleted

## 2016-04-01 DIAGNOSIS — O09529 Supervision of elderly multigravida, unspecified trimester: Secondary | ICD-10-CM

## 2016-04-01 DIAGNOSIS — O09891 Supervision of other high risk pregnancies, first trimester: Secondary | ICD-10-CM

## 2016-04-01 DIAGNOSIS — O24419 Gestational diabetes mellitus in pregnancy, unspecified control: Secondary | ICD-10-CM

## 2016-04-01 NOTE — Progress Notes (Signed)
Per health department preanatal records

## 2016-04-02 ENCOUNTER — Encounter (HOSPITAL_COMMUNITY): Payer: Self-pay | Admitting: Certified Nurse Midwife

## 2016-04-02 ENCOUNTER — Inpatient Hospital Stay (HOSPITAL_COMMUNITY)
Admission: AD | Admit: 2016-04-02 | Discharge: 2016-04-02 | Disposition: A | Discharge status: Home / Self Care | Payer: Medicaid Other | Source: Ambulatory Visit | Attending: Obstetrics & Gynecology | Admitting: Obstetrics & Gynecology

## 2016-04-02 DIAGNOSIS — Z3A31 31 weeks gestation of pregnancy: Secondary | ICD-10-CM | POA: Insufficient documentation

## 2016-04-02 DIAGNOSIS — O09523 Supervision of elderly multigravida, third trimester: Secondary | ICD-10-CM | POA: Diagnosis not present

## 2016-04-02 DIAGNOSIS — O09891 Supervision of other high risk pregnancies, first trimester: Secondary | ICD-10-CM

## 2016-04-02 DIAGNOSIS — O219 Vomiting of pregnancy, unspecified: Secondary | ICD-10-CM | POA: Diagnosis not present

## 2016-04-02 DIAGNOSIS — O212 Late vomiting of pregnancy: Secondary | ICD-10-CM | POA: Insufficient documentation

## 2016-04-02 DIAGNOSIS — D509 Iron deficiency anemia, unspecified: Secondary | ICD-10-CM | POA: Diagnosis not present

## 2016-04-02 DIAGNOSIS — R109 Unspecified abdominal pain: Secondary | ICD-10-CM | POA: Diagnosis present

## 2016-04-02 DIAGNOSIS — O9989 Other specified diseases and conditions complicating pregnancy, childbirth and the puerperium: Secondary | ICD-10-CM | POA: Diagnosis not present

## 2016-04-02 DIAGNOSIS — O09529 Supervision of elderly multigravida, unspecified trimester: Secondary | ICD-10-CM

## 2016-04-02 DIAGNOSIS — O99013 Anemia complicating pregnancy, third trimester: Secondary | ICD-10-CM | POA: Insufficient documentation

## 2016-04-02 DIAGNOSIS — D508 Other iron deficiency anemias: Secondary | ICD-10-CM

## 2016-04-02 DIAGNOSIS — N949 Unspecified condition associated with female genital organs and menstrual cycle: Secondary | ICD-10-CM

## 2016-04-02 DIAGNOSIS — O24419 Gestational diabetes mellitus in pregnancy, unspecified control: Secondary | ICD-10-CM | POA: Insufficient documentation

## 2016-04-02 LAB — COMPREHENSIVE METABOLIC PANEL
ALK PHOS: 66 U/L (ref 38–126)
ALT: 12 U/L — ABNORMAL LOW (ref 14–54)
ANION GAP: 3 — AB (ref 5–15)
AST: 20 U/L (ref 15–41)
Albumin: 2.7 g/dL — ABNORMAL LOW (ref 3.5–5.0)
BUN: 12 mg/dL (ref 6–20)
CALCIUM: 8.3 mg/dL — AB (ref 8.9–10.3)
CO2: 27 mmol/L (ref 22–32)
Chloride: 104 mmol/L (ref 101–111)
Creatinine, Ser: 0.65 mg/dL (ref 0.44–1.00)
GFR calc non Af Amer: >60 mL/min (ref >60)
Glucose, Bld: 109 mg/dL — ABNORMAL HIGH (ref 65–99)
Potassium: 3.6 mmol/L (ref 3.5–5.1)
SODIUM: 134 mmol/L — AB (ref 135–145)
TOTAL PROTEIN: 6.8 g/dL (ref 6.5–8.1)
Total Bilirubin: 0.5 mg/dL (ref 0.3–1.2)

## 2016-04-02 LAB — CBC WITH DIFFERENTIAL/PLATELET
BASOS ABS: 0 10*3/uL (ref 0.0–0.1)
BASOS PCT: 0 %
EOS ABS: 0.1 10*3/uL (ref 0.0–0.7)
Eosinophils Relative: 1 %
HCT: 28.4 % — ABNORMAL LOW (ref 36.0–46.0)
HEMOGLOBIN: 9.5 g/dL — AB (ref 12.0–15.0)
LYMPHS ABS: 2.7 10*3/uL (ref 0.7–4.0)
Lymphocytes Relative: 28 %
MCH: 28.4 pg (ref 26.0–34.0)
MCHC: 33.5 g/dL (ref 30.0–36.0)
MCV: 84.8 fL (ref 78.0–100.0)
Monocytes Absolute: 0.4 10*3/uL (ref 0.1–1.0)
Monocytes Relative: 4 %
NEUTROS PCT: 67 %
Neutro Abs: 6.3 10*3/uL (ref 1.7–7.7)
Platelets: 334 10*3/uL (ref 150–400)
RBC: 3.35 MIL/uL — AB (ref 3.87–5.11)
RDW: 14.1 % (ref 11.5–15.5)
WBC: 9.4 10*3/uL (ref 4.0–10.5)

## 2016-04-02 LAB — URINALYSIS, ROUTINE W REFLEX MICROSCOPIC
BILIRUBIN URINE: NEGATIVE
Glucose, UA: NEGATIVE mg/dL
HGB URINE DIPSTICK: NEGATIVE
Ketones, ur: NEGATIVE mg/dL
Leukocytes, UA: NEGATIVE
Nitrite: NEGATIVE
Protein, ur: NEGATIVE mg/dL
SPECIFIC GRAVITY, URINE: 1.015 (ref 1.005–1.030)
pH: 7 (ref 5.0–8.0)

## 2016-04-02 LAB — LIPASE, BLOOD: LIPASE: 32 U/L (ref 11–51)

## 2016-04-02 MED ORDER — ONDANSETRON HCL 4 MG PO TABS
4.0000 mg | ORAL_TABLET | Freq: Once | ORAL | Status: AC
  Administered 2016-04-02: 4 mg via ORAL
  Filled 2016-04-02: qty 1

## 2016-04-02 MED ORDER — FAMOTIDINE 20 MG PO TABS: 20.0000 mg | ORAL_TABLET | Freq: Two times a day (BID) | ORAL | 2 refills | Status: DC

## 2016-04-02 MED ORDER — GI COCKTAIL ~~LOC~~
30.0000 mL | Freq: Once | ORAL | Status: AC
  Administered 2016-04-02: 30 mL via ORAL
  Filled 2016-04-02: qty 30

## 2016-04-02 MED ORDER — ONDANSETRON HCL 4 MG PO TABS: 4.0000 mg | ORAL_TABLET | Freq: Three times a day (TID) | ORAL | 0 refills | Status: DC | PRN

## 2016-04-02 MED ORDER — FERROUS FUMARATE 325 (106 FE) MG PO TABS: 1.0000 | ORAL_TABLET | Freq: Every day | ORAL | 3 refills | Status: DC

## 2016-04-02 NOTE — Discharge Instructions (Signed)
Iron Deficiency Anemia, Adult Iron-deficiency anemia is when you have a low amount of red blood cells or hemoglobin. This happens because you have too little iron in your body. Hemoglobin carries oxygen to parts of the body. Anemia can cause your body to not get enough oxygen. It may or may not cause symptoms. Follow these instructions at home: Medicines   Take over-the-counter and prescription medicines only as told by your doctor. This includes iron pills (supplements) and vitamins.  If you cannot handle taking iron pills by mouth, ask your doctor about getting iron through:  A vein (intravenously).  A shot (injection) into a muscle.  Take iron pills when your stomach is empty. If you cannot handle this, take them with food.  Do not drink milk or take antacids at the same time as your iron pills.  To prevent trouble pooping (constipation), eat fiber or take medicine (stool softener) as told by your doctor. Eating and drinking   Talk with your doctor before changing the foods you eat. He or she may tell you to eat foods that have a lot of iron, such as:  Liver.  Lowfat (lean) beef.  Breads and cereals that have iron added to them (fortified breads and cereals).  Eggs.  Dried fruit.  Dark green, leafy vegetables.  Drink enough fluid to keep your pee (urine) clear or pale yellow.  Eat fresh fruits and vegetables that are high in vitamin C. They help your body to use iron. Foods with a lot of vitamin C include:  Oranges.  Peppers.  Tomatoes.  Mangoes. General instructions   Return to your normal activities as told by your doctor. Ask your doctor what activities are safe for you.  Keep yourself clean, and keep things clean around you (your surroundings). Anemia can make you get sick more easily.  Keep all follow-up visits as told by your doctor. This is important. Contact a doctor if:  You feel sick to your stomach (nauseous).  You throw up (vomit).  You feel  weak.  You are sweating for no clear reason.  You have trouble pooping, such as:  Pooping (having a bowel movement) less than 3 times a week.  Straining to poop.  Having poop that is hard, dry, or larger than normal.  Feeling full or bloated.  Pain in the lower belly.  Not feeling better after pooping. Get help right away if:  You pass out (faint). If this happens, do not drive yourself to the hospital. Call your local emergency services (911 in the [place]  You have chest pain.  You have shortness of breath that:  Is very bad.  Gets worse with physical activity.  You have a fast heartbeat.  You get light-headed when getting up from sitting or lying down. This information is not intended to replace advice given to you by your health care provider. Make sure you discuss any questions you have with your health care provider. Document Released: 02/12/2010 Document Revised: 09/30/2015 Document Reviewed: 09/30/2015 Elsevier Interactive Patient Education  2017 [employer] Inc[employer]   Nausea and Vomiting, Adult Feeling sick to your stomach (nausea) means that your stomach is upset or you feel like you have to throw up (vomit). Feeling more and more sick to your stomach can lead to throwing up. Throwing up happens when food and liquid from your stomach are thrown up and out the mouth. Throwing up can make you feel weak and cause you to get dehydrated. Dehydration can make you tired and  thirsty, make you have a dry mouth, and make it so you pee (urinate) less often. Older adults and people with other diseases or a weak defense system (immune system) are at higher risk for dehydration. If you feel sick to your stomach or if you throw up, it is important to follow instructions from your doctor about how to take care of yourself. Follow these instructions at home: Eating and drinking  Follow these instructions as told by your doctor:  Take an oral rehydration solution (ORS). This is a drink  that is sold at pharmacies and stores.  Drink clear fluids in small amounts as you are able, such as:  Water.  Ice chips.  Diluted fruit juice.  Low-calorie sports drinks.  Eat bland, easy-to-digest foods in small amounts as you are able, such as:  Bananas.  Applesauce.  Rice.  Low-fat (lean) meats.  Toast.  Crackers.  Avoid fluids that have a lot of sugar or caffeine in them.  Avoid alcohol.  Avoid spicy or fatty foods. General instructions   Drink enough fluid to keep your pee (urine) clear or pale yellow.  Wash your hands often. If you cannot use soap and water, use hand sanitizer.  Make sure that all people in your home wash their hands well and often.  Take over-the-counter and prescription medicines only as told by your doctor.  Rest at home while you get better.  Watch your condition for any changes.  Breathe slowly and deeply when you feel sick to your stomach.  Keep all follow-up visits as told by your doctor. This is important. Contact a doctor if:  You have a fever.  You cannot keep fluids down.  Your symptoms get worse.  You have new symptoms.  You feel sick to your stomach for more than two days.  You feel light-headed or dizzy.  You have a headache.  You have muscle cramps. Get help right away if:  You have pain in your chest, neck, arm, or jaw.  You feel very weak or you pass out (faint).  You throw up again and again.  You see blood in your throw-up.  Your throw-up looks like black coffee grounds.  You have bloody or black poop (stools) or poop that look like tar.  You have a very bad headache, a stiff neck, or both.  You have a rash.  You have very bad pain, cramping, or bloating in your belly (abdomen).  You have trouble breathing.  You are breathing very quickly.  Your heart is beating very quickly.  Your skin feels cold and clammy.  You feel confused.  You have pain when you pee.  You have signs of  dehydration, such as:  Dark pee, hardly any pee, or no pee.  Cracked lips.  Dry mouth.  Sunken eyes.  Sleepiness.  Weakness. These symptoms may be an emergency. Do not wait to see if the symptoms will go away. Get medical help right away. Call your local emergency services (911 in the [place] Do not drive yourself to the hospital. This information is not intended to replace advice given to you by your health care provider. Make sure you discuss any questions you have with your health care provider. Document Released: 06/29/2007 Document Revised: 07/31/2015 Document Reviewed: 09/16/2014 Elsevier Interactive Patient Education  2017 [employer] Inc. Round Ligament Pain The round ligament is a cord of muscle and tissue that helps to support the uterus. It can become a source of pain during pregnancy if it  becomes stretched or twisted as the baby grows. The pain usually begins in the second trimester of pregnancy, and it can come and go until the baby is delivered. It is not a serious problem, and it does not cause harm to the baby. Round ligament pain is usually a short, sharp, and pinching pain, but it can also be a dull, lingering, and aching pain. The pain is felt in the lower side of the abdomen or in the groin. It usually starts deep in the groin and moves up to the outside of the hip area. Pain can occur with:  A sudden change in position.  Rolling over in bed.  Coughing or sneezing.  Physical activity. Follow these instructions at home: Watch your condition for any changes. Take these steps to help with your pain:  When the pain starts, relax. Then try:  Sitting down.  Flexing your knees up to your abdomen.  Lying on your side with one pillow under your abdomen and another pillow between your legs.  Sitting in a warm bath for 15-20 minutes or until the pain goes away.  Take over-the-counter and prescription medicines only as told by your health care provider.  Move slowly  when you sit and stand.  Avoid long walks if they cause pain.  Stop or lessen your physical activities if they cause pain. Contact a health care provider if:  Your pain does not go away with treatment.  You feel pain in your back that you did not have before.  Your medicine is not helping. Get help right away if:  You develop a fever or chills.  You develop uterine contractions.  You develop vaginal bleeding.  You develop nausea or vomiting.  You develop diarrhea.  You have pain when you urinate. This information is not intended to replace advice given to you by your health care provider. Make sure you discuss any questions you have with your health care provider. Document Released: 10/20/2007 Document Revised: 06/18/2015 Document Reviewed: 03/19/2014 Elsevier Interactive Patient Education  2017 [employer].

## 2016-04-02 NOTE — MAU Provider Note (Signed)
Chief Complaint:  Abdominal Pain; Chest Pain; and Back Pain  An interpretor was used for the entire exam.  HPI: Michelle Scott is a 41 y.o. G8P7 at 6861w0d who presents to maternity admissions reporting nausea/vomiting and abdominal pain.  Symptoms began yesterday. Last emesis was at 7AM, had vomited once only. Feels nauseous after eating but able to hold down meals today. Denies blood in vomit. Stooling every 3 days, it is soft, without blood. Has not taken iron supplements due to gelatin in ingredients (against religion). Patient also stating having some dizziness when standing.   Patient also c/o abdominal pain, located in the epigastric region. Pain has been going on for 2 weeks, but got worse yesterday. Pain is worse with eating, if she throws up it feels better. Has not taken anything for pain or nausea. Has not had this pain before. Pain radiates to back. Denies F/C. Has some associated chest pain with nausea/epigastric pain.   Infrequent to rare contractions throughout day, none that are regular. Denies leakage of fluid or vaginal bleeding. Good fetal movement.   Pregnancy Course:   Past Medical History: Past Medical History:  Diagnosis Date  . Anemia   . Gestational diabetes   . UTI (urinary tract infection)     Past obstetric history: OB History  Gravida Para Term Preterm AB Living  8 7       6   SAB TAB Ectopic Multiple Live Births  0 0 0   7    # Outcome Date GA Lbr Len/2nd Weight Sex Delivery Anes PTL Lv  8 Current           7 Para 06/09/09     Vag-Spont   LIV     Birth Comments: GDM  6 Para 08/20/06     Vag-Spont   LIV  5 Para 02/10/04     Vag-Spont   LIV  4 Para 03/27/99     Vag-Spont   LIV  3 Para 09/03/96     Vag-Spont   LIV  2 Para 09/03/95     Vag-Spont   ND     Birth Comments: newborn died day after birth- reason unknown  1 Para 09/10/93     Vag-Spont   LIV      Past Surgical History: Past Surgical History:  Procedure Laterality Date  . NO PAST SURGERIES        Family History: Family History  Problem Relation Age of Onset  . Hypertension Mother   . Diabetes Mother     Social History: Social History  Substance Use Topics  . Smoking status: Never Smoker  . Smokeless tobacco: Never Used  . Alcohol use No    Allergies: No Known Allergies  Meds:  Prescriptions Prior to Admission  Medication Sig Dispense Refill Last Dose  . Prenat w/o A Vit-FeFum-FePo-FA (CONCEPT OB) 130-92.4-1 MG CAPS Take 1 tablet by mouth daily. 30 capsule 12 04/02/2016 at Unknown time  . traMADol (ULTRAM) 50 MG tablet Take 1-2 tablets (50-100 mg total) by mouth every 6 (six) hours as needed for severe pain. 30 tablet 0     I have reviewed patient's Past Medical Hx, Surgical Hx, Family Hx, Social Hx, medications and allergies.   ROS:  A comprehensive ROS was negative except per HPI.    Physical Exam   Patient Vitals for the past 24 hrs:  BP Temp Temp src Pulse Resp SpO2 Height Weight  04/02/16 2239 - 98.4 F (36.9 C) Oral - - - - -  04/02/16 1919 106/65 98.2 F (36.8 C) - 106 16 100 % 5\' 4"  (1.626 m) 149 lb (67.6 kg)   Constitutional: Well-developed, well-nourished female in no acute distress.  Cardiovascular: normal rate, rhythm, no murmurs Respiratory: normal effort, CTAB GI: Abd soft, tenderness to palpation in epigastric region, RUQ, RLQ, suprapubic. Gravid appropriate for gestational age.   MS: Extremities nontender, no edema, normal ROM Neurologic: Alert and oriented x 4.  GU: Neg CVAT.      Labs: Results for orders placed or performed during the hospital encounter of 04/02/16 (from the past 24 hour(s))  Urinalysis, Routine w reflex microscopic     Status: None   Collection Time: 04/02/16  7:30 PM  Result Value Ref Range   Color, Urine YELLOW YELLOW   APPearance CLEAR CLEAR   Specific Gravity, Urine 1.015 1.005 - 1.030   pH 7.0 5.0 - 8.0   Glucose, UA NEGATIVE NEGATIVE mg/dL   Hgb urine dipstick NEGATIVE NEGATIVE   Bilirubin Urine  NEGATIVE NEGATIVE   Ketones, ur NEGATIVE NEGATIVE mg/dL   Protein, ur NEGATIVE NEGATIVE mg/dL   Nitrite NEGATIVE NEGATIVE   Leukocytes, UA NEGATIVE NEGATIVE  Comprehensive metabolic panel     Status: Abnormal   Collection Time: 04/02/16  9:13 PM  Result Value Ref Range   Sodium 134 (L) 135 - 145 mmol/L   Potassium 3.6 3.5 - 5.1 mmol/L   Chloride 104 101 - 111 mmol/L   CO2 27 22 - 32 mmol/L   Glucose, Bld 109 (H) 65 - 99 mg/dL   BUN 12 6 - 20 mg/dL   Creatinine, Ser 1.61 0.44 - 1.00 mg/dL   Calcium 8.3 (L) 8.9 - 10.3 mg/dL   Total Protein 6.8 6.5 - 8.1 g/dL   Albumin 2.7 (L) 3.5 - 5.0 g/dL   AST 20 15 - 41 U/L   ALT 12 (L) 14 - 54 U/L   Alkaline Phosphatase 66 38 - 126 U/L   Total Bilirubin 0.5 0.3 - 1.2 mg/dL   GFR calc non Af Amer >60 >60 mL/min   GFR calc Af Amer >60 >60 mL/min   Anion gap 3 (L) 5 - 15  Lipase, blood     Status: None   Collection Time: 04/02/16  9:13 PM  Result Value Ref Range   Lipase 32 11 - 51 U/L  CBC with Differential/Platelet     Status: Abnormal   Collection Time: 04/02/16  9:13 PM  Result Value Ref Range   WBC 9.4 4.0 - 10.5 K/uL   RBC 3.35 (L) 3.87 - 5.11 MIL/uL   Hemoglobin 9.5 (L) 12.0 - 15.0 g/dL   HCT 09.6 (L) 04.5 - 40.9 %   MCV 84.8 78.0 - 100.0 fL   MCH 28.4 26.0 - 34.0 pg   MCHC 33.5 30.0 - 36.0 g/dL   RDW 81.1 91.4 - 78.2 %   Platelets 334 150 - 400 K/uL   Neutrophils Relative % 67 %   Neutro Abs 6.3 1.7 - 7.7 K/uL   Lymphocytes Relative 28 %   Lymphs Abs 2.7 0.7 - 4.0 K/uL   Monocytes Relative 4 %   Monocytes Absolute 0.4 0.1 - 1.0 K/uL   Eosinophils Relative 1 %   Eosinophils Absolute 0.1 0.0 - 0.7 K/uL   Basophils Relative 0 %   Basophils Absolute 0.0 0.0 - 0.1 K/uL    Imaging:  No results found.  MAU Course: Orthostatics - mildly positive (HR increased by 10bpm from lying to sitting, and +dizziness  at 3 min standing) GI cocktail, Zofran- symptoms improved UA - NEG CMP/CBC/Lipase - NEG for infection, liver or  pancreatic concerns. +Anemic.  EKG - NSR, no ST/T wave changes NST - reactive   MDM: Plan of care reviewed with patient, including labs and tests ordered and medical treatment. Discussed with patient no acute findings of infection, liver problems, pancreas problems. She had improvement with medication, discussed medications Rx for home to help. Encouraged maternity belt to assist in pelvic pain, likely round ligament pain. Return precautions given for preterm labor or illness (F/C, worsening pain, unable to tolerate anything PO). Also discussed there are iron tablets without gelatin can get OTC (sent more to pharmacy). Encourage PO intake.  OK for discharge.  I personally reviewed the patient's NST today, found to be REACTIVE. 155 bpm, mod var, +accels, no decels. CTX: None.    Assessment: 1. Nausea/vomiting in pregnancy   2. Gestational diabetes mellitus (GDM), antepartum, gestational diabetes method of control unspecified   3. Supervision of other high risk pregnancies, first trimester   4. Antepartum multigravida of advanced maternal age   30. Round ligament pain   6. Other iron deficiency anemia     Plan: Discharge home in stable condition.  Encouraged fluid hydration Maternity belt Daily famotidine/tums prn Preterm Labor precautions and fetal kick counts  Follow-up Information    [facility]. Schedule an appointment as soon as possible for a visit in 1 week(s).   Why:  Follow up symptoms and OB visit Contact information: [address] [place] [address] [phone]           Allergies as of 04/02/2016   No Known Allergies     Medication List    TAKE these medications   CONCEPT OB 130-92.4-1 MG Caps Take 1 tablet by mouth daily.   famotidine 20 MG tablet Commonly known as:  PEPCID Take 1 tablet (20 mg total) by mouth 2 (two) times daily.   ferrous fumarate 325 (106 Fe) MG Tabs tablet Commonly known as:  HEMOCYTE - 106 mg FE Take 1 tablet  (106 mg of iron total) by mouth daily.   ondansetron 4 MG tablet Commonly known as:  ZOFRAN Take 1 tablet (4 mg total) by mouth every 8 (eight) hours as needed for nausea or vomiting.   traMADol 50 MG tablet Commonly known as:  ULTRAM Take 1-2 tablets (50-100 mg total) by mouth every 6 (six) hours as needed for severe pain.       Jen Mow, DO OB Fellow [facility], [facility] 04/02/2016 10:53 PM

## 2016-04-02 NOTE — MAU Note (Addendum)
Having pain in stomach when baby kicks for awhile. Told doctors and told was normal. Comes and goes. Some lower back pain since yesterday. Entire pregnancy has had n/v and that is when chest hurts. Leaking fld for 2 months. Told doc and was told was ok

## 2016-04-04 ENCOUNTER — Encounter: Payer: Self-pay | Admitting: General Practice

## 2016-04-04 ENCOUNTER — Encounter: Payer: Medicaid Other | Attending: Obstetrics & Gynecology | Admitting: *Deleted

## 2016-04-04 ENCOUNTER — Ambulatory Visit: Payer: Medicaid Other | Admitting: *Deleted

## 2016-04-04 DIAGNOSIS — Z3A00 Weeks of gestation of pregnancy not specified: Secondary | ICD-10-CM | POA: Diagnosis not present

## 2016-04-04 DIAGNOSIS — O24410 Gestational diabetes mellitus in pregnancy, diet controlled: Secondary | ICD-10-CM | POA: Insufficient documentation

## 2016-04-04 MED ORDER — GLUCOSE BLOOD VI STRP: ORAL_STRIP | 12 refills | Status: DC

## 2016-04-04 MED ORDER — ACCU-CHEK FASTCLIX LANCETS MISC: 1.0000 | Freq: Four times a day (QID) | 0 refills | Status: DC

## 2016-04-04 MED ORDER — ACCU-CHEK GUIDE W/DEVICE KIT: 1.0000 | PACK | Freq: Once | 0 refills | Status: AC

## 2016-04-04 NOTE — Progress Notes (Signed)
  Patient was seen on 04/04/2016 for Gestational Diabetes self-management . She is here with her husband and Arabic interpretor. She states history of GDM with last pregnancy 7 years ago, but she did not test her BG at that time. She also states [facility] The following learning objectives were met by the patient :   States the definition of Gestational Diabetes  States when to check blood glucose levels  Demonstrates proper blood glucose monitoring techniques  States the effect of stress and exercise on blood glucose levels  States the importance of limiting caffeine and abstaining from alcohol and smoking  Plan:  Aim for 3 Carb Choices per meal (45 grams) +/- 1 either way  Aim for 1-2 Carbs per snack Begin checking BG before breakfast and 2 hours after first bite of breakfast, lunch and dinner as directed by MD  Take medication if directed by MD  Blood glucose monitor Rx called into pharmacy @ [facility] @ [address], [place], [place] Phone: [phone]  Patient instructed to test pre breakfast and 2 hours each meal as directed by MD Bring Log Book to every medical appointment   Patient instructed to monitor glucose levels: FBS: 60 - <90 2 hour: <120  Patient received the following handouts:  Nutrition Diabetes and Pregnancy in Arabiic  Carbohydrate Counting List in Arabic  Patient will be seen for follow-up as needed.

## 2016-04-11 ENCOUNTER — Encounter: Payer: Self-pay | Admitting: Obstetrics and Gynecology

## 2016-04-11 ENCOUNTER — Ambulatory Visit (INDEPENDENT_AMBULATORY_CARE_PROVIDER_SITE_OTHER): Payer: Medicaid Other | Admitting: Family Medicine

## 2016-04-11 VITALS — BP 107/59 | HR 92 | Wt 152.1 lb

## 2016-04-11 DIAGNOSIS — O09891 Supervision of other high risk pregnancies, first trimester: Secondary | ICD-10-CM

## 2016-04-11 DIAGNOSIS — Z789 Other specified health status: Secondary | ICD-10-CM | POA: Diagnosis not present

## 2016-04-11 DIAGNOSIS — Z641 Problems related to multiparity: Secondary | ICD-10-CM | POA: Insufficient documentation

## 2016-04-11 DIAGNOSIS — O09523 Supervision of elderly multigravida, third trimester: Secondary | ICD-10-CM

## 2016-04-11 DIAGNOSIS — O24410 Gestational diabetes mellitus in pregnancy, diet controlled: Secondary | ICD-10-CM

## 2016-04-11 LAB — POCT URINALYSIS DIP (DEVICE)
Bilirubin Urine: NEGATIVE
Glucose, UA: NEGATIVE mg/dL
HGB URINE DIPSTICK: NEGATIVE
Ketones, ur: NEGATIVE mg/dL
Nitrite: NEGATIVE
PH: 7 (ref 5.0–8.0)
PROTEIN: NEGATIVE mg/dL
SPECIFIC GRAVITY, URINE: 1.02 (ref 1.005–1.030)
UROBILINOGEN UA: 0.2 mg/dL (ref 0.0–1.0)

## 2016-04-11 NOTE — Progress Notes (Signed)
    PRENATAL VISIT NOTE  Subjective:  Michelle Scott is a 41 y.o. G8P7 at 8637w2d being seen today for transferring prenatal care.  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  Patient reports occasional contractions.  Contractions: Not present. Vag. Bleeding: None.  Movement: Present. Denies leaking of fluid.   The following portions of the patient's history were reviewed and updated as appropriate: allergies, current medications, past family history, past medical history, past social history, past surgical history and problem list. Problem list updated.  Objective:   Vitals:   04/11/16 0802  BP: (!) 107/59  Pulse: 92  Weight: 152 lb 1.6 oz (69 kg)    Fetal Status: Fetal Heart Rate (bpm): 156 Fundal Height: 32 cm Movement: Present     General:  Alert, oriented and cooperative. Patient is in no acute distress.  Skin: Skin is warm and dry. No rash noted.   Cardiovascular: Normal heart rate noted  Respiratory: Normal respiratory effort, no problems with respiration noted  Abdomen: Soft, gravid, appropriate for gestational age. Pain/Pressure: Present     Pelvic:  Cervical exam deferred        Extremities: Normal range of motion.  Edema: None  Mental Status: Normal mood and affect. Normal behavior. Normal judgment and thought content.  FBS [phone] (2 of 3 are out of range) 2 hour pp 85-167 (2 of 9 are out of range) Assessment and Plan:  Pregnancy: G8P7 at 5737w2d  1. Supervision of other high risk pregnancies, first trimester Continue  prenatal care.   2. Diet controlled gestational diabetes mellitus (GDM) in third trimester Fairly good control, tighten up diet, add exercise and see if this  helps.  3. Grand multiparity At risk of PPH  4. Elderly multigravida in third trimester Quad + for increased DSR--declined genetic counseling  5. Language barrier Arabic interpreter used  Preterm labor symptoms and general obstetric precautions including but not limited to vaginal bleeding, contractions, leaking of fluid and fetal movement were reviewed in detail with the patient. Please refer to After Visit Summary for other counseling recommendations.  Return in 2 weeks (on 04/25/2016).   Michelle Boresanya S Akosua Constantine, MD

## 2016-04-11 NOTE — Patient Instructions (Signed)
 Third Trimester of Pregnancy The third trimester is from week 28 through week 40 (months 7 through 9). The third trimester is a time when the unborn baby (fetus) is growing rapidly. At the end of the ninth month, the fetus is about 20 inches in length and weighs 6-10 pounds. Body changes during your third trimester Your body will continue to go through many changes during pregnancy. The changes vary from woman to woman. During the third trimester:  Your weight will continue to increase. You can expect to gain 25-35 pounds (11-16 kg) by the end of the pregnancy.  You may begin to get stretch marks on your hips, abdomen, and breasts.  You may urinate more often because the fetus is moving lower into your pelvis and pressing on your bladder.  You may develop or continue to have heartburn. This is caused by increased hormones that slow down muscles in the digestive tract.  You may develop or continue to have constipation because increased hormones slow digestion and cause the muscles that push waste through your intestines to relax.  You may develop hemorrhoids. These are swollen veins (varicose veins) in the rectum that can itch or be painful.  You may develop swollen, bulging veins (varicose veins) in your legs.  You may have increased body aches in the pelvis, back, or thighs. This is due to weight gain and increased hormones that are relaxing your joints.  You may have changes in your hair. These can include thickening of your hair, rapid growth, and changes in texture. Some women also have hair loss during or after pregnancy, or hair that feels dry or thin. Your hair will most likely return to normal after your baby is born.  Your breasts will continue to grow and they will continue to become tender. A yellow fluid (colostrum) may leak from your breasts. This is the first milk you are producing for your baby.  Your belly button may stick out.  You may notice more swelling in your  hands, face, or ankles.  You may have increased tingling or numbness in your hands, arms, and legs. The skin on your belly may also feel numb.  You may feel short of breath because of your expanding uterus.  You may have more problems sleeping. This can be caused by the size of your belly, increased need to urinate, and an increase in your body's metabolism.  You may notice the fetus "dropping," or moving lower in your abdomen (lightening).  You may have increased vaginal discharge.  You may notice your joints feel loose and you may have pain around your pelvic bone.  What to expect at prenatal visits You will have prenatal exams every 2 weeks until week 36. Then you will have weekly prenatal exams. During a routine prenatal visit:  You will be weighed to make sure you and the baby are growing normally.  Your blood pressure will be taken.  Your abdomen will be measured to track your baby's growth.  The fetal heartbeat will be listened to.  Any test results from the previous visit will be discussed.  You may have a cervical check near your due date to see if your cervix has softened or thinned (effaced).  You will be tested for Group B streptococcus. This happens between 35 and 37 weeks.  Your health care provider may ask you:  What your birth plan is.  How you are feeling.  If you are feeling the baby move.  If you have   had any abnormal symptoms, such as leaking fluid, bleeding, severe headaches, or abdominal cramping.  If you are using any tobacco products, including cigarettes, chewing tobacco, and electronic cigarettes.  If you have any questions.  Other tests or screenings that may be performed during your third trimester include:  Blood tests that check for low iron levels (anemia).  Fetal testing to check the health, activity level, and growth of the fetus. Testing is done if you have certain medical conditions or if there are problems during the  pregnancy.  Nonstress test (NST). This test checks the health of your baby to make sure there are no signs of problems, such as the baby not getting enough oxygen. During this test, a belt is placed around your belly. The baby is made to move, and its heart rate is monitored during movement.  What is false labor? False labor is a condition in which you feel small, irregular tightenings of the muscles in the womb (contractions) that usually go away with rest, changing position, or drinking water. These are called Braxton Hicks contractions. Contractions may last for hours, days, or even weeks before true labor sets in. If contractions come at regular intervals, become more frequent, increase in intensity, or become painful, you should see your health care provider. What are the signs of labor?  Abdominal cramps.  Regular contractions that start at 10 minutes apart and become stronger and more frequent with time.  Contractions that start on the top of the uterus and spread down to the lower abdomen and back.  Increased pelvic pressure and dull back pain.  A watery or bloody mucus discharge that comes from the vagina.  Leaking of amniotic fluid. This is also known as your "water breaking." It could be a slow trickle or a gush. Let your health care provider know if it has a color or strange odor. If you have any of these signs, call your health care provider right away, even if it is before your due date. Follow these instructions at home: Medicines  Follow your health care provider's instructions regarding medicine use. Specific medicines may be either safe or unsafe to take during pregnancy.  Take a prenatal vitamin that contains at least 600 micrograms (mcg) of folic acid.  If you develop constipation, try taking a stool softener if your health care provider approves. Eating and drinking  Eat a balanced diet that includes fresh fruits and vegetables, whole grains, good sources of protein  such as meat, eggs, or tofu, and low-fat dairy. Your health care provider will help you determine the amount of weight gain that is right for you.  Avoid raw meat and uncooked cheese. These carry germs that can cause birth defects in the baby.  If you have low calcium intake from food, talk to your health care provider about whether you should take a daily calcium supplement.  Eat four or five small meals rather than three large meals a day.  Limit foods that are high in fat and processed sugars, such as fried and sweet foods.  To prevent constipation: ? Drink enough fluid to keep your urine clear or pale yellow. ? Eat foods that are high in fiber, such as fresh fruits and vegetables, whole grains, and beans. Activity  Exercise only as directed by your health care provider. Most women can continue their usual exercise routine during pregnancy. Try to exercise for 30 minutes at least 5 days a week. Stop exercising if you experience uterine contractions.  Avoid   heavy lifting.  Do not exercise in extreme heat or humidity, or at high altitudes.  Wear low-heel, comfortable shoes.  Practice good posture.  You may continue to have sex unless your health care provider tells you otherwise. Relieving pain and discomfort  Take frequent breaks and rest with your legs elevated if you have leg cramps or low back pain.  Take warm sitz baths to soothe any pain or discomfort caused by hemorrhoids. Use hemorrhoid cream if your health care provider approves.  Wear a good support bra to prevent discomfort from breast tenderness.  If you develop varicose veins: ? Wear support pantyhose or compression stockings as told by your healthcare provider. ? Elevate your feet for 15 minutes, 3-4 times a day. Prenatal care  Write down your questions. Take them to your prenatal visits.  Keep all your prenatal visits as told by your health care provider. This is important. Safety  Wear your seat belt at  all times when driving.  Make a list of emergency phone numbers, including numbers for family, friends, the hospital, and police and fire departments. General instructions  Avoid cat litter boxes and soil used by cats. These carry germs that can cause birth defects in the baby. If you have a cat, ask someone to clean the litter box for you.  Do not travel far distances unless it is absolutely necessary and only with the approval of your health care provider.  Do not use hot tubs, steam rooms, or saunas.  Do not drink alcohol.  Do not use any products that contain nicotine or tobacco, such as cigarettes and e-cigarettes. If you need help quitting, ask your health care provider.  Do not use any medicinal herbs or unprescribed drugs. These chemicals affect the formation and growth of the baby.  Do not douche or use tampons or scented sanitary pads.  Do not cross your legs for long periods of time.  To prepare for the arrival of your baby: ? Take prenatal classes to understand, practice, and ask questions about labor and delivery. ? Make a trial run to the hospital. ? Visit the hospital and tour the maternity area. ? Arrange for maternity or paternity leave through employers. ? Arrange for family and friends to take care of pets while you are in the hospital. ? Purchase a rear-facing car seat and make sure you know how to install it in your car. ? Pack your hospital bag. ? Prepare the baby's nursery. Make sure to remove all pillows and stuffed animals from the baby's crib to prevent suffocation.  Visit your dentist if you have not gone during your pregnancy. Use a soft toothbrush to brush your teeth and be gentle when you floss. Contact a health care provider if:  You are unsure if you are in labor or if your water has broken.  You become dizzy.  You have mild pelvic cramps, pelvic pressure, or nagging pain in your abdominal area.  You have lower back pain.  You have persistent  nausea, vomiting, or diarrhea.  You have an unusual or bad smelling vaginal discharge.  You have pain when you urinate. Get help right away if:  Your water breaks before 37 weeks.  You have regular contractions less than 5 minutes apart before 37 weeks.  You have a fever.  You are leaking fluid from your vagina.  You have spotting or bleeding from your vagina.  You have severe abdominal pain or cramping.  You have rapid weight loss or weight   gain.  You have shortness of breath with chest pain.  You notice sudden or extreme swelling of your face, hands, ankles, feet, or legs.  Your baby makes fewer than 10 movements in 2 hours.  You have severe headaches that do not go away when you take medicine.  You have vision changes. Summary  The third trimester is from week 28 through week 40, months 7 through 9. The third trimester is a time when the unborn baby (fetus) is growing rapidly.  During the third trimester, your discomfort may increase as you and your baby continue to gain weight. You may have abdominal, leg, and back pain, sleeping problems, and an increased need to urinate.  During the third trimester your breasts will keep growing and they will continue to become tender. A yellow fluid (colostrum) may leak from your breasts. This is the first milk you are producing for your baby.  False labor is a condition in which you feel small, irregular tightenings of the muscles in the womb (contractions) that eventually go away. These are called Braxton Hicks contractions. Contractions may last for hours, days, or even weeks before true labor sets in.  Signs of labor can include: abdominal cramps; regular contractions that start at 10 minutes apart and become stronger and more frequent with time; watery or bloody mucus discharge that comes from the vagina; increased pelvic pressure and dull back pain; and leaking of amniotic fluid. This information is not intended to replace advice  given to you by your health care provider. Make sure you discuss any questions you have with your health care provider. Document Released: 01/04/2001 Document Revised: 06/18/2015 Document Reviewed: 03/13/2012 [employer] Interactive Patient Education  2017 [employer]   Breastfeeding Deciding to breastfeed is one of the best choices you can make for you and your baby. A change in hormones during pregnancy causes your breast tissue to grow and increases the number and size of your milk ducts. These hormones also allow proteins, sugars, and fats from your blood supply to make breast milk in your milk-producing glands. Hormones prevent breast milk from being released before your baby is born as well as prompt milk flow after birth. Once breastfeeding has begun, thoughts of your baby, as well as his or her sucking or crying, can stimulate the release of milk from your milk-producing glands. Benefits of breastfeeding For Your Baby  Your first milk (colostrum) helps your baby's digestive system function better.  There are antibodies in your milk that help your baby fight off infections.  Your baby has a lower incidence of asthma, allergies, and sudden infant death syndrome.  The nutrients in breast milk are better for your baby than infant formulas and are designed uniquely for your baby's needs.  Breast milk improves your baby's brain development.  Your baby is less likely to develop other conditions, such as childhood obesity, asthma, or type 2 diabetes mellitus.  For You  Breastfeeding helps to create a very special bond between you and your baby.  Breastfeeding is convenient. Breast milk is always available at the correct temperature and costs nothing.  Breastfeeding helps to burn calories and helps you lose the weight gained during pregnancy.  Breastfeeding makes your uterus contract to its prepregnancy size faster and slows bleeding (lochia) after you give birth.  Breastfeeding helps  to lower your risk of developing type 2 diabetes mellitus, osteoporosis, and breast or ovarian cancer later in life.  Signs that your baby is hungry Early Signs of Hunger    Increased alertness or activity.  Stretching.  Movement of the head from side to side.  Movement of the head and opening of the mouth when the corner of the mouth or cheek is stroked (rooting).  Increased sucking sounds, smacking lips, cooing, sighing, or squeaking.  Hand-to-mouth movements.  Increased sucking of fingers or hands.  Late Signs of Hunger  Fussing.  Intermittent crying.  Extreme Signs of Hunger Signs of extreme hunger will require calming and consoling before your baby will be able to breastfeed successfully. Do not wait for the following signs of extreme hunger to occur before you initiate breastfeeding:  Restlessness.  A loud, strong cry.  Screaming.  Breastfeeding basics Breastfeeding Initiation  Find a comfortable place to sit or lie down, with your neck and back well supported.  Place a pillow or rolled up blanket under your baby to bring him or her to the level of your breast (if you are seated). Nursing pillows are specially designed to help support your arms and your baby while you breastfeed.  Make sure that your baby's abdomen is facing your abdomen.  Gently massage your breast. With your fingertips, massage from your chest wall toward your nipple in a circular motion. This encourages milk flow. You may need to continue this action during the feeding if your milk flows slowly.  Support your breast with 4 fingers underneath and your thumb above your nipple. Make sure your fingers are well away from your nipple and your baby's mouth.  Stroke your baby's lips gently with your finger or nipple.  When your baby's mouth is open wide enough, quickly bring your baby to your breast, placing your entire nipple and as much of the colored area around your nipple (areola) as possible into  your baby's mouth. ? More areola should be visible above your baby's upper lip than below the lower lip. ? Your baby's tongue should be between his or her lower gum and your breast.  Ensure that your baby's mouth is correctly positioned around your nipple (latched). Your baby's lips should create a seal on your breast and be turned out (everted).  It is common for your baby to suck about 2-3 minutes in order to start the flow of breast milk.  Latching Teaching your baby how to latch on to your breast properly is very important. An improper latch can cause nipple pain and decreased milk supply for you and poor weight gain in your baby. Also, if your baby is not latched onto your nipple properly, he or she may swallow some air during feeding. This can make your baby fussy. Burping your baby when you switch breasts during the feeding can help to get rid of the air. However, teaching your baby to latch on properly is still the best way to prevent fussiness from swallowing air while breastfeeding. Signs that your baby has successfully latched on to your nipple:  Silent tugging or silent sucking, without causing you pain.  Swallowing heard between every 3-4 sucks.  Muscle movement above and in front of his or her ears while sucking.  Signs that your baby has not successfully latched on to nipple:  Sucking sounds or smacking sounds from your baby while breastfeeding.  Nipple pain.  If you think your baby has not latched on correctly, slip your finger into the corner of your baby's mouth to break the suction and place it between your baby's gums. Attempt breastfeeding initiation again. Signs of Successful Breastfeeding Signs from your baby:  A   gradual decrease in the number of sucks or complete cessation of sucking.  Falling asleep.  Relaxation of his or her body.  Retention of a small amount of milk in his or her mouth.  Letting go of your breast by himself or herself.  Signs from  you:  Breasts that have increased in firmness, weight, and size 1-3 hours after feeding.  Breasts that are softer immediately after breastfeeding.  Increased milk volume, as well as a change in milk consistency and color by the fifth day of breastfeeding.  Nipples that are not sore, cracked, or bleeding.  Signs That Your Baby is Getting Enough Milk  Wetting at least 1-2 diapers during the first 24 hours after birth.  Wetting at least 5-6 diapers every 24 hours for the first week after birth. The urine should be clear or pale yellow by 5 days after birth.  Wetting 6-8 diapers every 24 hours as your baby continues to grow and develop.  At least 3 stools in a 24-hour period by age 5 days. The stool should be soft and yellow.  At least 3 stools in a 24-hour period by age 7 days. The stool should be seedy and yellow.  No loss of weight greater than 10% of birth weight during the first 3 days of age.  Average weight gain of 4-7 ounces (113-198 g) per week after age 4 days.  Consistent daily weight gain by age 5 days, without weight loss after the age of 2 weeks.  After a feeding, your baby may spit up a small amount. This is common. Breastfeeding frequency and duration Frequent feeding will help you make more milk and can prevent sore nipples and breast engorgement. Breastfeed when you feel the need to reduce the fullness of your breasts or when your baby shows signs of hunger. This is called "breastfeeding on demand." Avoid introducing a pacifier to your baby while you are working to establish breastfeeding (the first 4-6 weeks after your baby is born). After this time you may choose to use a pacifier. Research has shown that pacifier use during the first year of a baby's life decreases the risk of sudden infant death syndrome (SIDS). Allow your baby to feed on each breast as long as he or she wants. Breastfeed until your baby is finished feeding. When your baby unlatches or falls asleep  while feeding from the first breast, offer the second breast. Because newborns are often sleepy in the first few weeks of life, you may need to awaken your baby to get him or her to feed. Breastfeeding times will vary from baby to baby. However, the following rules can serve as a guide to help you ensure that your baby is properly fed:  Newborns (babies 4 weeks of age or younger) may breastfeed every 1-3 hours.  Newborns should not go longer than 3 hours during the day or 5 hours during the night without breastfeeding.  You should breastfeed your baby a minimum of 8 times in a 24-hour period until you begin to introduce solid foods to your baby at around 6 months of age.  Breast milk pumping Pumping and storing breast milk allows you to ensure that your baby is exclusively fed your breast milk, even at times when you are unable to breastfeed. This is especially important if you are going back to work while you are still breastfeeding or when you are not able to be present during feedings. Your lactation consultant can give you guidelines on how   long it is safe to store breast milk. A breast pump is a machine that allows you to pump milk from your breast into a sterile bottle. The pumped breast milk can then be stored in a refrigerator or freezer. Some breast pumps are operated by hand, while others use electricity. Ask your lactation consultant which type will work best for you. Breast pumps can be purchased, but some hospitals and breastfeeding support groups lease breast pumps on a monthly basis. A lactation consultant can teach you how to hand express breast milk, if you prefer not to use a pump. Caring for your breasts while you breastfeed Nipples can become dry, cracked, and sore while breastfeeding. The following recommendations can help keep your breasts moisturized and healthy:  Avoid using soap on your nipples.  Wear a supportive bra. Although not required, special nursing bras and tank  tops are designed to allow access to your breasts for breastfeeding without taking off your entire bra or top. Avoid wearing underwire-style bras or extremely tight bras.  Air dry your nipples for 3-4minutes after each feeding.  Use only cotton bra pads to absorb leaked breast milk. Leaking of breast milk between feedings is normal.  Use lanolin on your nipples after breastfeeding. Lanolin helps to maintain your skin's normal moisture barrier. If you use pure lanolin, you do not need to wash it off before feeding your baby again. Pure lanolin is not toxic to your baby. You may also hand express a few drops of breast milk and gently massage that milk into your nipples and allow the milk to air dry.  In the first few weeks after giving birth, some women experience extremely full breasts (engorgement). Engorgement can make your breasts feel heavy, warm, and tender to the touch. Engorgement peaks within 3-5 days after you give birth. The following recommendations can help ease engorgement:  Completely empty your breasts while breastfeeding or pumping. You may want to start by applying warm, moist heat (in the shower or with warm water-soaked hand towels) just before feeding or pumping. This increases circulation and helps the milk flow. If your baby does not completely empty your breasts while breastfeeding, pump any extra milk after he or she is finished.  Wear a snug bra (nursing or regular) or tank top for 1-2 days to signal your body to slightly decrease milk production.  Apply ice packs to your breasts, unless this is too uncomfortable for you.  Make sure that your baby is latched on and positioned properly while breastfeeding.  If engorgement persists after 48 hours of following these recommendations, contact your health care provider or a lactation consultant. Overall health care recommendations while breastfeeding  Eat healthy foods. Alternate between meals and snacks, eating 3 of each per  day. Because what you eat affects your breast milk, some of the foods may make your baby more irritable than usual. Avoid eating these foods if you are sure that they are negatively affecting your baby.  Drink milk, fruit juice, and water to satisfy your thirst (about 10 glasses a day).  Rest often, relax, and continue to take your prenatal vitamins to prevent fatigue, stress, and anemia.  Continue breast self-awareness checks.  Avoid chewing and smoking tobacco. Chemicals from cigarettes that pass into breast milk and exposure to secondhand smoke may harm your baby.  Avoid alcohol and drug use, including marijuana. Some medicines that may be harmful to your baby can pass through breast milk. It is important to ask your health care   provider before taking any medicine, including all over-the-counter and prescription medicine as well as vitamin and herbal supplements. It is possible to become pregnant while breastfeeding. If birth control is desired, ask your health care provider about options that will be safe for your baby. Contact a health care provider if:  You feel like you want to stop breastfeeding or have become frustrated with breastfeeding.  You have painful breasts or nipples.  Your nipples are cracked or bleeding.  Your breasts are red, tender, or warm.  You have a swollen area on either breast.  You have a fever or chills.  You have nausea or vomiting.  You have drainage other than breast milk from your nipples.  Your breasts do not become full before feedings by the fifth day after you give birth.  You feel sad and depressed.  Your baby is too sleepy to eat well.  Your baby is having trouble sleeping.  Your baby is wetting less than 3 diapers in a 24-hour period.  Your baby has less than 3 stools in a 24-hour period.  Your baby's skin or the white part of his or her eyes becomes yellow.  Your baby is not gaining weight by 5 days of age. Get help right away  if:  Your baby is overly tired (lethargic) and does not want to wake up and feed.  Your baby develops an unexplained fever. This information is not intended to replace advice given to you by your health care provider. Make sure you discuss any questions you have with your health care provider. Document Released: 01/10/2005 Document Revised: 06/24/2015 Document Reviewed: 07/04/2012 Elsevier Interactive Patient Education  2017 Elsevier Inc.  

## 2016-04-25 ENCOUNTER — Encounter: Payer: Self-pay | Admitting: General Practice

## 2016-04-25 ENCOUNTER — Ambulatory Visit (INDEPENDENT_AMBULATORY_CARE_PROVIDER_SITE_OTHER): Payer: Medicaid Other | Admitting: Obstetrics & Gynecology

## 2016-04-25 ENCOUNTER — Encounter: Payer: Self-pay | Admitting: Obstetrics & Gynecology

## 2016-04-25 VITALS — BP 94/54 | HR 79 | Wt 155.0 lb

## 2016-04-25 DIAGNOSIS — O09523 Supervision of elderly multigravida, third trimester: Secondary | ICD-10-CM | POA: Diagnosis not present

## 2016-04-25 DIAGNOSIS — O24410 Gestational diabetes mellitus in pregnancy, diet controlled: Secondary | ICD-10-CM | POA: Diagnosis present

## 2016-04-25 DIAGNOSIS — O09891 Supervision of other high risk pregnancies, first trimester: Secondary | ICD-10-CM

## 2016-04-25 MED ORDER — DOCUSATE SODIUM 100 MG PO CAPS: 100.0000 mg | ORAL_CAPSULE | Freq: Two times a day (BID) | ORAL | 2 refills | Status: DC

## 2016-04-25 MED ORDER — GLYBURIDE 1.25 MG PO TABS: 1.2500 mg | ORAL_TABLET | Freq: Two times a day (BID) | ORAL | 1 refills | Status: DC

## 2016-04-25 NOTE — Progress Notes (Signed)
[employer] video interpreter [place] #[id number] used for encounter. Pt reports constipation.  Rx for Colace sent to pharmacy. Pt also encouraged to increase po water intake and eat plenty of vegetables.

## 2016-04-25 NOTE — Progress Notes (Signed)
Video Interpreter # [id number]

## 2016-04-25 NOTE — Progress Notes (Signed)
    PRENATAL VISIT NOTE  Subjective:  Michelle Scott is a 41 y.o. G8P7 at [redacted]w[redacted]d being seen today for ongoing prenatal care.  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  Patient reports left calf apin from ankle to knee..  Contractions: Not present. Vag. Bleeding: None.  Movement: Present. Denies leaking of fluid.   The following portions of the patient's history were reviewed and updated as appropriate: allergies, current medications, past family history, past medical history, past social history, past surgical history and problem list. Problem list updated.  Objective:   Vitals:   04/25/16 1101  BP: (!) 94/54  Pulse: 79  Weight: 155 lb (70.3 kg)    Fetal Status: Fetal Heart Rate (bpm): 163 Fundal Height: 34 cm Movement: Present     General:  Alert, oriented and cooperative. Patient is in no acute distress.  Skin: Skin is warm and dry. No rash noted.   Cardiovascular: Normal heart rate noted  Respiratory: Normal respiratory effort, no problems with respiration noted  Abdomen: Soft, gravid, appropriate for gestational age. Pain/Pressure: Present     Pelvic:  Cervical exam deferred        Extremities: Normal range of motion.  Edema: Trace; both calves equal, no redness or hot areas, neg homans  Mental Status: Normal mood and affect. Normal behavior. Normal judgment and thought content.   Assessment and Plan:  Pregnancy: G8P7 at [redacted]w[redacted]d  1. Medication controlled gestational diabetes mellitus (GDM) in third trimester Past 7 days, most values are slightly elevated.  Start Glyburide 1.25 mg bid.  Start 2x week testing.   2. Supervision of other high risk pregnancies, first trimester Pt's calves ar  27 cm bilaterally.  Neg homans, no warmth or redness.  Pt given instructions to come to ED or the office if any of these things occur  3. Elderly multigravida in third trimester Refused genetics.  Preterm labor symptoms and general obstetric precautions including but not limited to vaginal bleeding, contractions, leaking of fluid and fetal movement were reviewed in detail with the patient. Please refer to After Visit Summary for other counseling recommendations.  Return in about 2 weeks (around 05/09/2016).   Lesly Dukes, MD

## 2016-04-28 ENCOUNTER — Other Ambulatory Visit: Payer: Medicaid Other | Admitting: Obstetrics & Gynecology

## 2016-05-02 ENCOUNTER — Ambulatory Visit (INDEPENDENT_AMBULATORY_CARE_PROVIDER_SITE_OTHER): Payer: Medicaid Other | Admitting: Family Medicine

## 2016-05-02 VITALS — BP 115/75 | HR 92 | Wt 151.1 lb

## 2016-05-02 DIAGNOSIS — O24414 Gestational diabetes mellitus in pregnancy, insulin controlled: Secondary | ICD-10-CM

## 2016-05-02 DIAGNOSIS — K5904 Chronic idiopathic constipation: Secondary | ICD-10-CM

## 2016-05-02 DIAGNOSIS — O09523 Supervision of elderly multigravida, third trimester: Secondary | ICD-10-CM | POA: Diagnosis not present

## 2016-05-02 DIAGNOSIS — O09891 Supervision of other high risk pregnancies, first trimester: Secondary | ICD-10-CM

## 2016-05-02 LAB — POCT URINALYSIS DIP (DEVICE)
BILIRUBIN URINE: NEGATIVE
GLUCOSE, UA: NEGATIVE mg/dL
Hgb urine dipstick: NEGATIVE
KETONES UR: NEGATIVE mg/dL
Nitrite: NEGATIVE
Protein, ur: NEGATIVE mg/dL
Specific Gravity, Urine: 1.015 (ref 1.005–1.030)
Urobilinogen, UA: 0.2 mg/dL (ref 0.0–1.0)
pH: 7 (ref 5.0–8.0)

## 2016-05-02 MED ORDER — SENNA 8.6 MG PO TABS: 1.0000 | ORAL_TABLET | Freq: Every day | ORAL | 0 refills | Status: DC

## 2016-05-02 MED ORDER — METFORMIN HCL 500 MG PO TABS: 500.0000 mg | ORAL_TABLET | Freq: Two times a day (BID) | ORAL | 1 refills | Status: DC

## 2016-05-02 NOTE — Patient Instructions (Signed)
 Third Trimester of Pregnancy The third trimester is from week 28 through week 40 (months 7 through 9). The third trimester is a time when the unborn baby (fetus) is growing rapidly. At the end of the ninth month, the fetus is about 20 inches in length and weighs 6-10 pounds. Body changes during your third trimester Your body will continue to go through many changes during pregnancy. The changes vary from woman to woman. During the third trimester:  Your weight will continue to increase. You can expect to gain 25-35 pounds (11-16 kg) by the end of the pregnancy.  You may begin to get stretch marks on your hips, abdomen, and breasts.  You may urinate more often because the fetus is moving lower into your pelvis and pressing on your bladder.  You may develop or continue to have heartburn. This is caused by increased hormones that slow down muscles in the digestive tract.  You may develop or continue to have constipation because increased hormones slow digestion and cause the muscles that push waste through your intestines to relax.  You may develop hemorrhoids. These are swollen veins (varicose veins) in the rectum that can itch or be painful.  You may develop swollen, bulging veins (varicose veins) in your legs.  You may have increased body aches in the pelvis, back, or thighs. This is due to weight gain and increased hormones that are relaxing your joints.  You may have changes in your hair. These can include thickening of your hair, rapid growth, and changes in texture. Some women also have hair loss during or after pregnancy, or hair that feels dry or thin. Your hair will most likely return to normal after your baby is born.  Your breasts will continue to grow and they will continue to become tender. A yellow fluid (colostrum) may leak from your breasts. This is the first milk you are producing for your baby.  Your belly button may stick out.  You may notice more swelling in your  hands, face, or ankles.  You may have increased tingling or numbness in your hands, arms, and legs. The skin on your belly may also feel numb.  You may feel short of breath because of your expanding uterus.  You may have more problems sleeping. This can be caused by the size of your belly, increased need to urinate, and an increase in your body's metabolism.  You may notice the fetus "dropping," or moving lower in your abdomen (lightening).  You may have increased vaginal discharge.  You may notice your joints feel loose and you may have pain around your pelvic bone.  What to expect at prenatal visits You will have prenatal exams every 2 weeks until week 36. Then you will have weekly prenatal exams. During a routine prenatal visit:  You will be weighed to make sure you and the baby are growing normally.  Your blood pressure will be taken.  Your abdomen will be measured to track your baby's growth.  The fetal heartbeat will be listened to.  Any test results from the previous visit will be discussed.  You may have a cervical check near your due date to see if your cervix has softened or thinned (effaced).  You will be tested for Group B streptococcus. This happens between 35 and 37 weeks.  Your health care provider may ask you:  What your birth plan is.  How you are feeling.  If you are feeling the baby move.  If you have   had any abnormal symptoms, such as leaking fluid, bleeding, severe headaches, or abdominal cramping.  If you are using any tobacco products, including cigarettes, chewing tobacco, and electronic cigarettes.  If you have any questions.  Other tests or screenings that may be performed during your third trimester include:  Blood tests that check for low iron levels (anemia).  Fetal testing to check the health, activity level, and growth of the fetus. Testing is done if you have certain medical conditions or if there are problems during the  pregnancy.  Nonstress test (NST). This test checks the health of your baby to make sure there are no signs of problems, such as the baby not getting enough oxygen. During this test, a belt is placed around your belly. The baby is made to move, and its heart rate is monitored during movement.  What is false labor? False labor is a condition in which you feel small, irregular tightenings of the muscles in the womb (contractions) that usually go away with rest, changing position, or drinking water. These are called Braxton Hicks contractions. Contractions may last for hours, days, or even weeks before true labor sets in. If contractions come at regular intervals, become more frequent, increase in intensity, or become painful, you should see your health care provider. What are the signs of labor?  Abdominal cramps.  Regular contractions that start at 10 minutes apart and become stronger and more frequent with time.  Contractions that start on the top of the uterus and spread down to the lower abdomen and back.  Increased pelvic pressure and dull back pain.  A watery or bloody mucus discharge that comes from the vagina.  Leaking of amniotic fluid. This is also known as your "water breaking." It could be a slow trickle or a gush. Let your health care provider know if it has a color or strange odor. If you have any of these signs, call your health care provider right away, even if it is before your due date. Follow these instructions at home: Medicines  Follow your health care provider's instructions regarding medicine use. Specific medicines may be either safe or unsafe to take during pregnancy.  Take a prenatal vitamin that contains at least 600 micrograms (mcg) of folic acid.  If you develop constipation, try taking a stool softener if your health care provider approves. Eating and drinking  Eat a balanced diet that includes fresh fruits and vegetables, whole grains, good sources of protein  such as meat, eggs, or tofu, and low-fat dairy. Your health care provider will help you determine the amount of weight gain that is right for you.  Avoid raw meat and uncooked cheese. These carry germs that can cause birth defects in the baby.  If you have low calcium intake from food, talk to your health care provider about whether you should take a daily calcium supplement.  Eat four or five small meals rather than three large meals a day.  Limit foods that are high in fat and processed sugars, such as fried and sweet foods.  To prevent constipation: ? Drink enough fluid to keep your urine clear or pale yellow. ? Eat foods that are high in fiber, such as fresh fruits and vegetables, whole grains, and beans. Activity  Exercise only as directed by your health care provider. Most women can continue their usual exercise routine during pregnancy. Try to exercise for 30 minutes at least 5 days a week. Stop exercising if you experience uterine contractions.  Avoid   heavy lifting.  Do not exercise in extreme heat or humidity, or at high altitudes.  Wear low-heel, comfortable shoes.  Practice good posture.  You may continue to have sex unless your health care provider tells you otherwise. Relieving pain and discomfort  Take frequent breaks and rest with your legs elevated if you have leg cramps or low back pain.  Take warm sitz baths to soothe any pain or discomfort caused by hemorrhoids. Use hemorrhoid cream if your health care provider approves.  Wear a good support bra to prevent discomfort from breast tenderness.  If you develop varicose veins: ? Wear support pantyhose or compression stockings as told by your healthcare provider. ? Elevate your feet for 15 minutes, 3-4 times a day. Prenatal care  Write down your questions. Take them to your prenatal visits.  Keep all your prenatal visits as told by your health care provider. This is important. Safety  Wear your seat belt at  all times when driving.  Make a list of emergency phone numbers, including numbers for family, friends, the hospital, and police and fire departments. General instructions  Avoid cat litter boxes and soil used by cats. These carry germs that can cause birth defects in the baby. If you have a cat, ask someone to clean the litter box for you.  Do not travel far distances unless it is absolutely necessary and only with the approval of your health care provider.  Do not use hot tubs, steam rooms, or saunas.  Do not drink alcohol.  Do not use any products that contain nicotine or tobacco, such as cigarettes and e-cigarettes. If you need help quitting, ask your health care provider.  Do not use any medicinal herbs or unprescribed drugs. These chemicals affect the formation and growth of the baby.  Do not douche or use tampons or scented sanitary pads.  Do not cross your legs for long periods of time.  To prepare for the arrival of your baby: ? Take prenatal classes to understand, practice, and ask questions about labor and delivery. ? Make a trial run to the hospital. ? Visit the hospital and tour the maternity area. ? Arrange for maternity or paternity leave through employers. ? Arrange for family and friends to take care of pets while you are in the hospital. ? Purchase a rear-facing car seat and make sure you know how to install it in your car. ? Pack your hospital bag. ? Prepare the baby's nursery. Make sure to remove all pillows and stuffed animals from the baby's crib to prevent suffocation.  Visit your dentist if you have not gone during your pregnancy. Use a soft toothbrush to brush your teeth and be gentle when you floss. Contact a health care provider if:  You are unsure if you are in labor or if your water has broken.  You become dizzy.  You have mild pelvic cramps, pelvic pressure, or nagging pain in your abdominal area.  You have lower back pain.  You have persistent  nausea, vomiting, or diarrhea.  You have an unusual or bad smelling vaginal discharge.  You have pain when you urinate. Get help right away if:  Your water breaks before 37 weeks.  You have regular contractions less than 5 minutes apart before 37 weeks.  You have a fever.  You are leaking fluid from your vagina.  You have spotting or bleeding from your vagina.  You have severe abdominal pain or cramping.  You have rapid weight loss or weight   gain.  You have shortness of breath with chest pain.  You notice sudden or extreme swelling of your face, hands, ankles, feet, or legs.  Your baby makes fewer than 10 movements in 2 hours.  You have severe headaches that do not go away when you take medicine.  You have vision changes. Summary  The third trimester is from week 28 through week 40, months 7 through 9. The third trimester is a time when the unborn baby (fetus) is growing rapidly.  During the third trimester, your discomfort may increase as you and your baby continue to gain weight. You may have abdominal, leg, and back pain, sleeping problems, and an increased need to urinate.  During the third trimester your breasts will keep growing and they will continue to become tender. A yellow fluid (colostrum) may leak from your breasts. This is the first milk you are producing for your baby.  False labor is a condition in which you feel small, irregular tightenings of the muscles in the womb (contractions) that eventually go away. These are called Braxton Hicks contractions. Contractions may last for hours, days, or even weeks before true labor sets in.  Signs of labor can include: abdominal cramps; regular contractions that start at 10 minutes apart and become stronger and more frequent with time; watery or bloody mucus discharge that comes from the vagina; increased pelvic pressure and dull back pain; and leaking of amniotic fluid. This information is not intended to replace advice  given to you by your health care provider. Make sure you discuss any questions you have with your health care provider. Document Released: 01/04/2001 Document Revised: 06/18/2015 Document Reviewed: 03/13/2012 [employer] Interactive Patient Education  2017 [employer]   Breastfeeding Deciding to breastfeed is one of the best choices you can make for you and your baby. A change in hormones during pregnancy causes your breast tissue to grow and increases the number and size of your milk ducts. These hormones also allow proteins, sugars, and fats from your blood supply to make breast milk in your milk-producing glands. Hormones prevent breast milk from being released before your baby is born as well as prompt milk flow after birth. Once breastfeeding has begun, thoughts of your baby, as well as his or her sucking or crying, can stimulate the release of milk from your milk-producing glands. Benefits of breastfeeding For Your Baby  Your first milk (colostrum) helps your baby's digestive system function better.  There are antibodies in your milk that help your baby fight off infections.  Your baby has a lower incidence of asthma, allergies, and sudden infant death syndrome.  The nutrients in breast milk are better for your baby than infant formulas and are designed uniquely for your baby's needs.  Breast milk improves your baby's brain development.  Your baby is less likely to develop other conditions, such as childhood obesity, asthma, or type 2 diabetes mellitus.  For You  Breastfeeding helps to create a very special bond between you and your baby.  Breastfeeding is convenient. Breast milk is always available at the correct temperature and costs nothing.  Breastfeeding helps to burn calories and helps you lose the weight gained during pregnancy.  Breastfeeding makes your uterus contract to its prepregnancy size faster and slows bleeding (lochia) after you give birth.  Breastfeeding helps  to lower your risk of developing type 2 diabetes mellitus, osteoporosis, and breast or ovarian cancer later in life.  Signs that your baby is hungry Early Signs of Hunger    Increased alertness or activity.  Stretching.  Movement of the head from side to side.  Movement of the head and opening of the mouth when the corner of the mouth or cheek is stroked (rooting).  Increased sucking sounds, smacking lips, cooing, sighing, or squeaking.  Hand-to-mouth movements.  Increased sucking of fingers or hands.  Late Signs of Hunger  Fussing.  Intermittent crying.  Extreme Signs of Hunger Signs of extreme hunger will require calming and consoling before your baby will be able to breastfeed successfully. Do not wait for the following signs of extreme hunger to occur before you initiate breastfeeding:  Restlessness.  A loud, strong cry.  Screaming.  Breastfeeding basics Breastfeeding Initiation  Find a comfortable place to sit or lie down, with your neck and back well supported.  Place a pillow or rolled up blanket under your baby to bring him or her to the level of your breast (if you are seated). Nursing pillows are specially designed to help support your arms and your baby while you breastfeed.  Make sure that your baby's abdomen is facing your abdomen.  Gently massage your breast. With your fingertips, massage from your chest wall toward your nipple in a circular motion. This encourages milk flow. You may need to continue this action during the feeding if your milk flows slowly.  Support your breast with 4 fingers underneath and your thumb above your nipple. Make sure your fingers are well away from your nipple and your baby's mouth.  Stroke your baby's lips gently with your finger or nipple.  When your baby's mouth is open wide enough, quickly bring your baby to your breast, placing your entire nipple and as much of the colored area around your nipple (areola) as possible into  your baby's mouth. ? More areola should be visible above your baby's upper lip than below the lower lip. ? Your baby's tongue should be between his or her lower gum and your breast.  Ensure that your baby's mouth is correctly positioned around your nipple (latched). Your baby's lips should create a seal on your breast and be turned out (everted).  It is common for your baby to suck about 2-3 minutes in order to start the flow of breast milk.  Latching Teaching your baby how to latch on to your breast properly is very important. An improper latch can cause nipple pain and decreased milk supply for you and poor weight gain in your baby. Also, if your baby is not latched onto your nipple properly, he or she may swallow some air during feeding. This can make your baby fussy. Burping your baby when you switch breasts during the feeding can help to get rid of the air. However, teaching your baby to latch on properly is still the best way to prevent fussiness from swallowing air while breastfeeding. Signs that your baby has successfully latched on to your nipple:  Silent tugging or silent sucking, without causing you pain.  Swallowing heard between every 3-4 sucks.  Muscle movement above and in front of his or her ears while sucking.  Signs that your baby has not successfully latched on to nipple:  Sucking sounds or smacking sounds from your baby while breastfeeding.  Nipple pain.  If you think your baby has not latched on correctly, slip your finger into the corner of your baby's mouth to break the suction and place it between your baby's gums. Attempt breastfeeding initiation again. Signs of Successful Breastfeeding Signs from your baby:  A   gradual decrease in the number of sucks or complete cessation of sucking.  Falling asleep.  Relaxation of his or her body.  Retention of a small amount of milk in his or her mouth.  Letting go of your breast by himself or herself.  Signs from  you:  Breasts that have increased in firmness, weight, and size 1-3 hours after feeding.  Breasts that are softer immediately after breastfeeding.  Increased milk volume, as well as a change in milk consistency and color by the fifth day of breastfeeding.  Nipples that are not sore, cracked, or bleeding.  Signs That Your Baby is Getting Enough Milk  Wetting at least 1-2 diapers during the first 24 hours after birth.  Wetting at least 5-6 diapers every 24 hours for the first week after birth. The urine should be clear or pale yellow by 5 days after birth.  Wetting 6-8 diapers every 24 hours as your baby continues to grow and develop.  At least 3 stools in a 24-hour period by age 5 days. The stool should be soft and yellow.  At least 3 stools in a 24-hour period by age 7 days. The stool should be seedy and yellow.  No loss of weight greater than 10% of birth weight during the first 3 days of age.  Average weight gain of 4-7 ounces (113-198 g) per week after age 4 days.  Consistent daily weight gain by age 5 days, without weight loss after the age of 2 weeks.  After a feeding, your baby may spit up a small amount. This is common. Breastfeeding frequency and duration Frequent feeding will help you make more milk and can prevent sore nipples and breast engorgement. Breastfeed when you feel the need to reduce the fullness of your breasts or when your baby shows signs of hunger. This is called "breastfeeding on demand." Avoid introducing a pacifier to your baby while you are working to establish breastfeeding (the first 4-6 weeks after your baby is born). After this time you may choose to use a pacifier. Research has shown that pacifier use during the first year of a baby's life decreases the risk of sudden infant death syndrome (SIDS). Allow your baby to feed on each breast as long as he or she wants. Breastfeed until your baby is finished feeding. When your baby unlatches or falls asleep  while feeding from the first breast, offer the second breast. Because newborns are often sleepy in the first few weeks of life, you may need to awaken your baby to get him or her to feed. Breastfeeding times will vary from baby to baby. However, the following rules can serve as a guide to help you ensure that your baby is properly fed:  Newborns (babies 4 weeks of age or younger) may breastfeed every 1-3 hours.  Newborns should not go longer than 3 hours during the day or 5 hours during the night without breastfeeding.  You should breastfeed your baby a minimum of 8 times in a 24-hour period until you begin to introduce solid foods to your baby at around 6 months of age.  Breast milk pumping Pumping and storing breast milk allows you to ensure that your baby is exclusively fed your breast milk, even at times when you are unable to breastfeed. This is especially important if you are going back to work while you are still breastfeeding or when you are not able to be present during feedings. Your lactation consultant can give you guidelines on how   long it is safe to store breast milk. A breast pump is a machine that allows you to pump milk from your breast into a sterile bottle. The pumped breast milk can then be stored in a refrigerator or freezer. Some breast pumps are operated by hand, while others use electricity. Ask your lactation consultant which type will work best for you. Breast pumps can be purchased, but some hospitals and breastfeeding support groups lease breast pumps on a monthly basis. A lactation consultant can teach you how to hand express breast milk, if you prefer not to use a pump. Caring for your breasts while you breastfeed Nipples can become dry, cracked, and sore while breastfeeding. The following recommendations can help keep your breasts moisturized and healthy:  Avoid using soap on your nipples.  Wear a supportive bra. Although not required, special nursing bras and tank  tops are designed to allow access to your breasts for breastfeeding without taking off your entire bra or top. Avoid wearing underwire-style bras or extremely tight bras.  Air dry your nipples for 3-4minutes after each feeding.  Use only cotton bra pads to absorb leaked breast milk. Leaking of breast milk between feedings is normal.  Use lanolin on your nipples after breastfeeding. Lanolin helps to maintain your skin's normal moisture barrier. If you use pure lanolin, you do not need to wash it off before feeding your baby again. Pure lanolin is not toxic to your baby. You may also hand express a few drops of breast milk and gently massage that milk into your nipples and allow the milk to air dry.  In the first few weeks after giving birth, some women experience extremely full breasts (engorgement). Engorgement can make your breasts feel heavy, warm, and tender to the touch. Engorgement peaks within 3-5 days after you give birth. The following recommendations can help ease engorgement:  Completely empty your breasts while breastfeeding or pumping. You may want to start by applying warm, moist heat (in the shower or with warm water-soaked hand towels) just before feeding or pumping. This increases circulation and helps the milk flow. If your baby does not completely empty your breasts while breastfeeding, pump any extra milk after he or she is finished.  Wear a snug bra (nursing or regular) or tank top for 1-2 days to signal your body to slightly decrease milk production.  Apply ice packs to your breasts, unless this is too uncomfortable for you.  Make sure that your baby is latched on and positioned properly while breastfeeding.  If engorgement persists after 48 hours of following these recommendations, contact your health care provider or a lactation consultant. Overall health care recommendations while breastfeeding  Eat healthy foods. Alternate between meals and snacks, eating 3 of each per  day. Because what you eat affects your breast milk, some of the foods may make your baby more irritable than usual. Avoid eating these foods if you are sure that they are negatively affecting your baby.  Drink milk, fruit juice, and water to satisfy your thirst (about 10 glasses a day).  Rest often, relax, and continue to take your prenatal vitamins to prevent fatigue, stress, and anemia.  Continue breast self-awareness checks.  Avoid chewing and smoking tobacco. Chemicals from cigarettes that pass into breast milk and exposure to secondhand smoke may harm your baby.  Avoid alcohol and drug use, including marijuana. Some medicines that may be harmful to your baby can pass through breast milk. It is important to ask your health care   provider before taking any medicine, including all over-the-counter and prescription medicine as well as vitamin and herbal supplements. It is possible to become pregnant while breastfeeding. If birth control is desired, ask your health care provider about options that will be safe for your baby. Contact a health care provider if:  You feel like you want to stop breastfeeding or have become frustrated with breastfeeding.  You have painful breasts or nipples.  Your nipples are cracked or bleeding.  Your breasts are red, tender, or warm.  You have a swollen area on either breast.  You have a fever or chills.  You have nausea or vomiting.  You have drainage other than breast milk from your nipples.  Your breasts do not become full before feedings by the fifth day after you give birth.  You feel sad and depressed.  Your baby is too sleepy to eat well.  Your baby is having trouble sleeping.  Your baby is wetting less than 3 diapers in a 24-hour period.  Your baby has less than 3 stools in a 24-hour period.  Your baby's skin or the white part of his or her eyes becomes yellow.  Your baby is not gaining weight by 5 days of age. Get help right away  if:  Your baby is overly tired (lethargic) and does not want to wake up and feed.  Your baby develops an unexplained fever. This information is not intended to replace advice given to you by your health care provider. Make sure you discuss any questions you have with your health care provider. Document Released: 01/10/2005 Document Revised: 06/24/2015 Document Reviewed: 07/04/2012 Elsevier Interactive Patient Education  2017 Elsevier Inc.  

## 2016-05-02 NOTE — Progress Notes (Signed)
    PRENATAL VISIT NOTE  Subjective:  Michelle Scott is a 41 y.o. G8P7 at [redacted]w[redacted]d being seen today for ongoing prenatal care.  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  Patient reports constipation, LLQ pain.  Contractions: Not present. Vag. Bleeding: None.  Movement: Present. Denies leaking of fluid.   The following portions of the patient's history were reviewed and updated as appropriate: allergies, current medications, past family history, past medical history, past social history, past surgical history and problem list. Problem list updated.  Objective:   Vitals:   05/02/16 1018  BP: 115/75  Pulse: 92  Weight: 151 lb 1.6 oz (68.5 kg)    Fetal Status:     Movement: Present     General:  Alert, oriented and cooperative. Patient is in no acute distress.  Skin: Skin is warm and dry. No rash noted.   Cardiovascular: Normal heart rate noted  Respiratory: Normal respiratory effort, no problems with respiration noted  Abdomen: Soft, gravid, appropriate for gestational age. Pain/Pressure: Absent     Pelvic:  Cervical exam deferred        Extremities: Normal range of motion.  Edema: None  Mental Status: Normal mood and affect. Normal behavior. Normal judgment and thought content.  FBS 79-98 2 hour pp  67-158 Stopped her glyburide after 5 days due to chest pain and BS came back up. NST reviewed and reactive. Assessment and Plan:  Pregnancy: G8P7 at [redacted]w[redacted]d  1. Supervision of other high risk pregnancies, first trimester   2. Insulin controlled gestational diabetes mellitus (GDM) in third trimester Change meds to Glucophage due to side effects of glyburide - Fetal nonstress test - metFORMIN  (GLUCOPHAGE) 500 MG tablet; Take 1 tablet (500 mg total) by mouth 2 (two) times daily with a meal.  Dispense: 60 tablet; Refill: 1 Has u/s for growth at 38 wks scheduled.  3. Elderly multigravida in third trimester  - Fetal nonstress test  4. Chronic idiopathic constipation Cannot take colace due to gelatin in capsule.  Preterm labor symptoms and general obstetric precautions including but not limited to vaginal bleeding, contractions, leaking of fluid and fetal movement were reviewed in detail with the patient. Please refer to After Visit Summary for other counseling recommendations.  Return in about 3 days (around 05/05/2016) for nst only.   Reva Bores, MD

## 2016-05-02 NOTE — Progress Notes (Signed)
Patient states she stopped taking the glyburide after 5 days because it was causing chest pain and difficulty sleeping

## 2016-05-05 ENCOUNTER — Other Ambulatory Visit: Payer: Medicaid Other | Admitting: Medical

## 2016-05-05 ENCOUNTER — Encounter (HOSPITAL_COMMUNITY): Payer: Self-pay

## 2016-05-05 ENCOUNTER — Other Ambulatory Visit: Payer: Self-pay | Admitting: Obstetrics & Gynecology

## 2016-05-05 ENCOUNTER — Ambulatory Visit (HOSPITAL_COMMUNITY)
Admission: RE | Admit: 2016-05-05 | Discharge: 2016-05-05 | Disposition: A | Discharge status: Home / Self Care | Payer: Medicaid Other | Source: Ambulatory Visit | Attending: Obstetrics & Gynecology | Admitting: Obstetrics & Gynecology

## 2016-05-05 DIAGNOSIS — Z3689 Encounter for other specified antenatal screening: Secondary | ICD-10-CM | POA: Diagnosis not present

## 2016-05-05 DIAGNOSIS — O24410 Gestational diabetes mellitus in pregnancy, diet controlled: Secondary | ICD-10-CM

## 2016-05-05 DIAGNOSIS — O09523 Supervision of elderly multigravida, third trimester: Secondary | ICD-10-CM | POA: Insufficient documentation

## 2016-05-05 DIAGNOSIS — Z3A35 35 weeks gestation of pregnancy: Secondary | ICD-10-CM

## 2016-05-05 DIAGNOSIS — O280 Abnormal hematological finding on antenatal screening of mother: Secondary | ICD-10-CM | POA: Diagnosis not present

## 2016-05-07 ENCOUNTER — Encounter (HOSPITAL_COMMUNITY): Payer: Self-pay | Admitting: *Deleted

## 2016-05-07 ENCOUNTER — Inpatient Hospital Stay (HOSPITAL_COMMUNITY)
Admission: AD | Admit: 2016-05-07 | Discharge: 2016-05-07 | Disposition: A | Discharge status: Home / Self Care | Payer: Medicaid Other | Source: Ambulatory Visit | Attending: Obstetrics and Gynecology | Admitting: Obstetrics and Gynecology

## 2016-05-07 DIAGNOSIS — R109 Unspecified abdominal pain: Secondary | ICD-10-CM | POA: Diagnosis present

## 2016-05-07 DIAGNOSIS — Z641 Problems related to multiparity: Secondary | ICD-10-CM

## 2016-05-07 DIAGNOSIS — O26893 Other specified pregnancy related conditions, third trimester: Secondary | ICD-10-CM | POA: Diagnosis not present

## 2016-05-07 DIAGNOSIS — O24414 Gestational diabetes mellitus in pregnancy, insulin controlled: Secondary | ICD-10-CM

## 2016-05-07 DIAGNOSIS — Z3A33 33 weeks gestation of pregnancy: Secondary | ICD-10-CM | POA: Insufficient documentation

## 2016-05-07 DIAGNOSIS — O09891 Supervision of other high risk pregnancies, first trimester: Secondary | ICD-10-CM

## 2016-05-07 DIAGNOSIS — O09523 Supervision of elderly multigravida, third trimester: Secondary | ICD-10-CM

## 2016-05-07 DIAGNOSIS — O479 False labor, unspecified: Secondary | ICD-10-CM

## 2016-05-07 NOTE — Discharge Instructions (Signed)

## 2016-05-07 NOTE — MAU Note (Signed)
Abdominal pain since 0400. Happens off and on some days but usually goes away. Denies LOF or bleeding.

## 2016-05-09 ENCOUNTER — Ambulatory Visit (INDEPENDENT_AMBULATORY_CARE_PROVIDER_SITE_OTHER): Payer: Medicaid Other | Admitting: Family Medicine

## 2016-05-09 VITALS — BP 113/69 | HR 80

## 2016-05-09 DIAGNOSIS — O24414 Gestational diabetes mellitus in pregnancy, insulin controlled: Secondary | ICD-10-CM

## 2016-05-09 DIAGNOSIS — O09893 Supervision of other high risk pregnancies, third trimester: Secondary | ICD-10-CM

## 2016-05-09 DIAGNOSIS — O09523 Supervision of elderly multigravida, third trimester: Secondary | ICD-10-CM

## 2016-05-09 DIAGNOSIS — O09891 Supervision of other high risk pregnancies, first trimester: Secondary | ICD-10-CM

## 2016-05-09 NOTE — Progress Notes (Addendum)
Video interpreter [place] # [id number] used for interpretation. Dr. Adrian Blackwater delayed for scheduled visit.  Pt did not want to wait and stated she will see the doctor next week. Pt will be scheduled for growth [place] on 5/3 due to accelerated [facility] measurement from [place] 4/12 per recommendation of Dr. Penne Lash.

## 2016-05-09 NOTE — Progress Notes (Signed)
NST reactive.

## 2016-05-12 ENCOUNTER — Ambulatory Visit (INDEPENDENT_AMBULATORY_CARE_PROVIDER_SITE_OTHER): Payer: Medicaid Other | Admitting: Student

## 2016-05-12 ENCOUNTER — Ambulatory Visit: Payer: Self-pay

## 2016-05-12 VITALS — BP 117/66 | HR 96

## 2016-05-12 DIAGNOSIS — O24414 Gestational diabetes mellitus in pregnancy, insulin controlled: Secondary | ICD-10-CM

## 2016-05-12 DIAGNOSIS — O09523 Supervision of elderly multigravida, third trimester: Secondary | ICD-10-CM

## 2016-05-12 NOTE — Progress Notes (Signed)
NST today reactive with AFI of 13.2

## 2016-05-16 ENCOUNTER — Other Ambulatory Visit (HOSPITAL_COMMUNITY)
Admission: RE | Admit: 2016-05-16 | Discharge: 2016-05-16 | Disposition: A | Discharge status: Home / Self Care | Payer: Medicaid Other | Source: Ambulatory Visit | Attending: Family Medicine | Admitting: Family Medicine

## 2016-05-16 ENCOUNTER — Ambulatory Visit (INDEPENDENT_AMBULATORY_CARE_PROVIDER_SITE_OTHER): Payer: Medicaid Other | Admitting: Family Medicine

## 2016-05-16 DIAGNOSIS — Z3A37 37 weeks gestation of pregnancy: Secondary | ICD-10-CM | POA: Diagnosis not present

## 2016-05-16 DIAGNOSIS — O09523 Supervision of elderly multigravida, third trimester: Secondary | ICD-10-CM

## 2016-05-16 DIAGNOSIS — O24414 Gestational diabetes mellitus in pregnancy, insulin controlled: Secondary | ICD-10-CM

## 2016-05-16 DIAGNOSIS — O0990 Supervision of high risk pregnancy, unspecified, unspecified trimester: Secondary | ICD-10-CM

## 2016-05-16 DIAGNOSIS — O0993 Supervision of high risk pregnancy, unspecified, third trimester: Secondary | ICD-10-CM

## 2016-05-16 DIAGNOSIS — Z113 Encounter for screening for infections with a predominantly sexual mode of transmission: Secondary | ICD-10-CM | POA: Diagnosis not present

## 2016-05-16 DIAGNOSIS — Z641 Problems related to multiparity: Secondary | ICD-10-CM

## 2016-05-16 NOTE — Patient Instructions (Signed)
 Third Trimester of Pregnancy The third trimester is from week 28 through week 40 (months 7 through 9). The third trimester is a time when the unborn baby (fetus) is growing rapidly. At the end of the ninth month, the fetus is about 20 inches in length and weighs 6-10 pounds. Body changes during your third trimester Your body will continue to go through many changes during pregnancy. The changes vary from woman to woman. During the third trimester:  Your weight will continue to increase. You can expect to gain 25-35 pounds (11-16 kg) by the end of the pregnancy.  You may begin to get stretch marks on your hips, abdomen, and breasts.  You may urinate more often because the fetus is moving lower into your pelvis and pressing on your bladder.  You may develop or continue to have heartburn. This is caused by increased hormones that slow down muscles in the digestive tract.  You may develop or continue to have constipation because increased hormones slow digestion and cause the muscles that push waste through your intestines to relax.  You may develop hemorrhoids. These are swollen veins (varicose veins) in the rectum that can itch or be painful.  You may develop swollen, bulging veins (varicose veins) in your legs.  You may have increased body aches in the pelvis, back, or thighs. This is due to weight gain and increased hormones that are relaxing your joints.  You may have changes in your hair. These can include thickening of your hair, rapid growth, and changes in texture. Some women also have hair loss during or after pregnancy, or hair that feels dry or thin. Your hair will most likely return to normal after your baby is born.  Your breasts will continue to grow and they will continue to become tender. A yellow fluid (colostrum) may leak from your breasts. This is the first milk you are producing for your baby.  Your belly button may stick out.  You may notice more swelling in your  hands, face, or ankles.  You may have increased tingling or numbness in your hands, arms, and legs. The skin on your belly may also feel numb.  You may feel short of breath because of your expanding uterus.  You may have more problems sleeping. This can be caused by the size of your belly, increased need to urinate, and an increase in your body's metabolism.  You may notice the fetus "dropping," or moving lower in your abdomen (lightening).  You may have increased vaginal discharge.  You may notice your joints feel loose and you may have pain around your pelvic bone.  What to expect at prenatal visits You will have prenatal exams every 2 weeks until week 36. Then you will have weekly prenatal exams. During a routine prenatal visit:  You will be weighed to make sure you and the baby are growing normally.  Your blood pressure will be taken.  Your abdomen will be measured to track your baby's growth.  The fetal heartbeat will be listened to.  Any test results from the previous visit will be discussed.  You may have a cervical check near your due date to see if your cervix has softened or thinned (effaced).  You will be tested for Group B streptococcus. This happens between 35 and 37 weeks.  Your health care provider may ask you:  What your birth plan is.  How you are feeling.  If you are feeling the baby move.  If you have   had any abnormal symptoms, such as leaking fluid, bleeding, severe headaches, or abdominal cramping.  If you are using any tobacco products, including cigarettes, chewing tobacco, and electronic cigarettes.  If you have any questions.  Other tests or screenings that may be performed during your third trimester include:  Blood tests that check for low iron levels (anemia).  Fetal testing to check the health, activity level, and growth of the fetus. Testing is done if you have certain medical conditions or if there are problems during the  pregnancy.  Nonstress test (NST). This test checks the health of your baby to make sure there are no signs of problems, such as the baby not getting enough oxygen. During this test, a belt is placed around your belly. The baby is made to move, and its heart rate is monitored during movement.  What is false labor? False labor is a condition in which you feel small, irregular tightenings of the muscles in the womb (contractions) that usually go away with rest, changing position, or drinking water. These are called Braxton Hicks contractions. Contractions may last for hours, days, or even weeks before true labor sets in. If contractions come at regular intervals, become more frequent, increase in intensity, or become painful, you should see your health care provider. What are the signs of labor?  Abdominal cramps.  Regular contractions that start at 10 minutes apart and become stronger and more frequent with time.  Contractions that start on the top of the uterus and spread down to the lower abdomen and back.  Increased pelvic pressure and dull back pain.  A watery or bloody mucus discharge that comes from the vagina.  Leaking of amniotic fluid. This is also known as your "water breaking." It could be a slow trickle or a gush. Let your health care provider know if it has a color or strange odor. If you have any of these signs, call your health care provider right away, even if it is before your due date. Follow these instructions at home: Medicines  Follow your health care provider's instructions regarding medicine use. Specific medicines may be either safe or unsafe to take during pregnancy.  Take a prenatal vitamin that contains at least 600 micrograms (mcg) of folic acid.  If you develop constipation, try taking a stool softener if your health care provider approves. Eating and drinking  Eat a balanced diet that includes fresh fruits and vegetables, whole grains, good sources of protein  such as meat, eggs, or tofu, and low-fat dairy. Your health care provider will help you determine the amount of weight gain that is right for you.  Avoid raw meat and uncooked cheese. These carry germs that can cause birth defects in the baby.  If you have low calcium intake from food, talk to your health care provider about whether you should take a daily calcium supplement.  Eat four or five small meals rather than three large meals a day.  Limit foods that are high in fat and processed sugars, such as fried and sweet foods.  To prevent constipation: ? Drink enough fluid to keep your urine clear or pale yellow. ? Eat foods that are high in fiber, such as fresh fruits and vegetables, whole grains, and beans. Activity  Exercise only as directed by your health care provider. Most women can continue their usual exercise routine during pregnancy. Try to exercise for 30 minutes at least 5 days a week. Stop exercising if you experience uterine contractions.  Avoid   heavy lifting.  Do not exercise in extreme heat or humidity, or at high altitudes.  Wear low-heel, comfortable shoes.  Practice good posture.  You may continue to have sex unless your health care provider tells you otherwise. Relieving pain and discomfort  Take frequent breaks and rest with your legs elevated if you have leg cramps or low back pain.  Take warm sitz baths to soothe any pain or discomfort caused by hemorrhoids. Use hemorrhoid cream if your health care provider approves.  Wear a good support bra to prevent discomfort from breast tenderness.  If you develop varicose veins: ? Wear support pantyhose or compression stockings as told by your healthcare provider. ? Elevate your feet for 15 minutes, 3-4 times a day. Prenatal care  Write down your questions. Take them to your prenatal visits.  Keep all your prenatal visits as told by your health care provider. This is important. Safety  Wear your seat belt at  all times when driving.  Make a list of emergency phone numbers, including numbers for family, friends, the hospital, and police and fire departments. General instructions  Avoid cat litter boxes and soil used by cats. These carry germs that can cause birth defects in the baby. If you have a cat, ask someone to clean the litter box for you.  Do not travel far distances unless it is absolutely necessary and only with the approval of your health care provider.  Do not use hot tubs, steam rooms, or saunas.  Do not drink alcohol.  Do not use any products that contain nicotine or tobacco, such as cigarettes and e-cigarettes. If you need help quitting, ask your health care provider.  Do not use any medicinal herbs or unprescribed drugs. These chemicals affect the formation and growth of the baby.  Do not douche or use tampons or scented sanitary pads.  Do not cross your legs for long periods of time.  To prepare for the arrival of your baby: ? Take prenatal classes to understand, practice, and ask questions about labor and delivery. ? Make a trial run to the hospital. ? Visit the hospital and tour the maternity area. ? Arrange for maternity or paternity leave through employers. ? Arrange for family and friends to take care of pets while you are in the hospital. ? Purchase a rear-facing car seat and make sure you know how to install it in your car. ? Pack your hospital bag. ? Prepare the baby's nursery. Make sure to remove all pillows and stuffed animals from the baby's crib to prevent suffocation.  Visit your dentist if you have not gone during your pregnancy. Use a soft toothbrush to brush your teeth and be gentle when you floss. Contact a health care provider if:  You are unsure if you are in labor or if your water has broken.  You become dizzy.  You have mild pelvic cramps, pelvic pressure, or nagging pain in your abdominal area.  You have lower back pain.  You have persistent  nausea, vomiting, or diarrhea.  You have an unusual or bad smelling vaginal discharge.  You have pain when you urinate. Get help right away if:  Your water breaks before 37 weeks.  You have regular contractions less than 5 minutes apart before 37 weeks.  You have a fever.  You are leaking fluid from your vagina.  You have spotting or bleeding from your vagina.  You have severe abdominal pain or cramping.  You have rapid weight loss or weight   gain.  You have shortness of breath with chest pain.  You notice sudden or extreme swelling of your face, hands, ankles, feet, or legs.  Your baby makes fewer than 10 movements in 2 hours.  You have severe headaches that do not go away when you take medicine.  You have vision changes. Summary  The third trimester is from week 28 through week 40, months 7 through 9. The third trimester is a time when the unborn baby (fetus) is growing rapidly.  During the third trimester, your discomfort may increase as you and your baby continue to gain weight. You may have abdominal, leg, and back pain, sleeping problems, and an increased need to urinate.  During the third trimester your breasts will keep growing and they will continue to become tender. A yellow fluid (colostrum) may leak from your breasts. This is the first milk you are producing for your baby.  False labor is a condition in which you feel small, irregular tightenings of the muscles in the womb (contractions) that eventually go away. These are called Braxton Hicks contractions. Contractions may last for hours, days, or even weeks before true labor sets in.  Signs of labor can include: abdominal cramps; regular contractions that start at 10 minutes apart and become stronger and more frequent with time; watery or bloody mucus discharge that comes from the vagina; increased pelvic pressure and dull back pain; and leaking of amniotic fluid. This information is not intended to replace advice  given to you by your health care provider. Make sure you discuss any questions you have with your health care provider. Document Released: 01/04/2001 Document Revised: 06/18/2015 Document Reviewed: 03/13/2012 [employer] Interactive Patient Education  2017 [employer]   Breastfeeding Deciding to breastfeed is one of the best choices you can make for you and your baby. A change in hormones during pregnancy causes your breast tissue to grow and increases the number and size of your milk ducts. These hormones also allow proteins, sugars, and fats from your blood supply to make breast milk in your milk-producing glands. Hormones prevent breast milk from being released before your baby is born as well as prompt milk flow after birth. Once breastfeeding has begun, thoughts of your baby, as well as his or her sucking or crying, can stimulate the release of milk from your milk-producing glands. Benefits of breastfeeding For Your Baby  Your first milk (colostrum) helps your baby's digestive system function better.  There are antibodies in your milk that help your baby fight off infections.  Your baby has a lower incidence of asthma, allergies, and sudden infant death syndrome.  The nutrients in breast milk are better for your baby than infant formulas and are designed uniquely for your baby's needs.  Breast milk improves your baby's brain development.  Your baby is less likely to develop other conditions, such as childhood obesity, asthma, or type 2 diabetes mellitus.  For You  Breastfeeding helps to create a very special bond between you and your baby.  Breastfeeding is convenient. Breast milk is always available at the correct temperature and costs nothing.  Breastfeeding helps to burn calories and helps you lose the weight gained during pregnancy.  Breastfeeding makes your uterus contract to its prepregnancy size faster and slows bleeding (lochia) after you give birth.  Breastfeeding helps  to lower your risk of developing type 2 diabetes mellitus, osteoporosis, and breast or ovarian cancer later in life.  Signs that your baby is hungry Early Signs of Hunger    Increased alertness or activity.  Stretching.  Movement of the head from side to side.  Movement of the head and opening of the mouth when the corner of the mouth or cheek is stroked (rooting).  Increased sucking sounds, smacking lips, cooing, sighing, or squeaking.  Hand-to-mouth movements.  Increased sucking of fingers or hands.  Late Signs of Hunger  Fussing.  Intermittent crying.  Extreme Signs of Hunger Signs of extreme hunger will require calming and consoling before your baby will be able to breastfeed successfully. Do not wait for the following signs of extreme hunger to occur before you initiate breastfeeding:  Restlessness.  A loud, strong cry.  Screaming.  Breastfeeding basics Breastfeeding Initiation  Find a comfortable place to sit or lie down, with your neck and back well supported.  Place a pillow or rolled up blanket under your baby to bring him or her to the level of your breast (if you are seated). Nursing pillows are specially designed to help support your arms and your baby while you breastfeed.  Make sure that your baby's abdomen is facing your abdomen.  Gently massage your breast. With your fingertips, massage from your chest wall toward your nipple in a circular motion. This encourages milk flow. You may need to continue this action during the feeding if your milk flows slowly.  Support your breast with 4 fingers underneath and your thumb above your nipple. Make sure your fingers are well away from your nipple and your baby's mouth.  Stroke your baby's lips gently with your finger or nipple.  When your baby's mouth is open wide enough, quickly bring your baby to your breast, placing your entire nipple and as much of the colored area around your nipple (areola) as possible into  your baby's mouth. ? More areola should be visible above your baby's upper lip than below the lower lip. ? Your baby's tongue should be between his or her lower gum and your breast.  Ensure that your baby's mouth is correctly positioned around your nipple (latched). Your baby's lips should create a seal on your breast and be turned out (everted).  It is common for your baby to suck about 2-3 minutes in order to start the flow of breast milk.  Latching Teaching your baby how to latch on to your breast properly is very important. An improper latch can cause nipple pain and decreased milk supply for you and poor weight gain in your baby. Also, if your baby is not latched onto your nipple properly, he or she may swallow some air during feeding. This can make your baby fussy. Burping your baby when you switch breasts during the feeding can help to get rid of the air. However, teaching your baby to latch on properly is still the best way to prevent fussiness from swallowing air while breastfeeding. Signs that your baby has successfully latched on to your nipple:  Silent tugging or silent sucking, without causing you pain.  Swallowing heard between every 3-4 sucks.  Muscle movement above and in front of his or her ears while sucking.  Signs that your baby has not successfully latched on to nipple:  Sucking sounds or smacking sounds from your baby while breastfeeding.  Nipple pain.  If you think your baby has not latched on correctly, slip your finger into the corner of your baby's mouth to break the suction and place it between your baby's gums. Attempt breastfeeding initiation again. Signs of Successful Breastfeeding Signs from your baby:  A   gradual decrease in the number of sucks or complete cessation of sucking.  Falling asleep.  Relaxation of his or her body.  Retention of a small amount of milk in his or her mouth.  Letting go of your breast by himself or herself.  Signs from  you:  Breasts that have increased in firmness, weight, and size 1-3 hours after feeding.  Breasts that are softer immediately after breastfeeding.  Increased milk volume, as well as a change in milk consistency and color by the fifth day of breastfeeding.  Nipples that are not sore, cracked, or bleeding.  Signs That Your Baby is Getting Enough Milk  Wetting at least 1-2 diapers during the first 24 hours after birth.  Wetting at least 5-6 diapers every 24 hours for the first week after birth. The urine should be clear or pale yellow by 5 days after birth.  Wetting 6-8 diapers every 24 hours as your baby continues to grow and develop.  At least 3 stools in a 24-hour period by age 5 days. The stool should be soft and yellow.  At least 3 stools in a 24-hour period by age 7 days. The stool should be seedy and yellow.  No loss of weight greater than 10% of birth weight during the first 3 days of age.  Average weight gain of 4-7 ounces (113-198 g) per week after age 4 days.  Consistent daily weight gain by age 5 days, without weight loss after the age of 2 weeks.  After a feeding, your baby may spit up a small amount. This is common. Breastfeeding frequency and duration Frequent feeding will help you make more milk and can prevent sore nipples and breast engorgement. Breastfeed when you feel the need to reduce the fullness of your breasts or when your baby shows signs of hunger. This is called "breastfeeding on demand." Avoid introducing a pacifier to your baby while you are working to establish breastfeeding (the first 4-6 weeks after your baby is born). After this time you may choose to use a pacifier. Research has shown that pacifier use during the first year of a baby's life decreases the risk of sudden infant death syndrome (SIDS). Allow your baby to feed on each breast as long as he or she wants. Breastfeed until your baby is finished feeding. When your baby unlatches or falls asleep  while feeding from the first breast, offer the second breast. Because newborns are often sleepy in the first few weeks of life, you may need to awaken your baby to get him or her to feed. Breastfeeding times will vary from baby to baby. However, the following rules can serve as a guide to help you ensure that your baby is properly fed:  Newborns (babies 4 weeks of age or younger) may breastfeed every 1-3 hours.  Newborns should not go longer than 3 hours during the day or 5 hours during the night without breastfeeding.  You should breastfeed your baby a minimum of 8 times in a 24-hour period until you begin to introduce solid foods to your baby at around 6 months of age.  Breast milk pumping Pumping and storing breast milk allows you to ensure that your baby is exclusively fed your breast milk, even at times when you are unable to breastfeed. This is especially important if you are going back to work while you are still breastfeeding or when you are not able to be present during feedings. Your lactation consultant can give you guidelines on how   long it is safe to store breast milk. A breast pump is a machine that allows you to pump milk from your breast into a sterile bottle. The pumped breast milk can then be stored in a refrigerator or freezer. Some breast pumps are operated by hand, while others use electricity. Ask your lactation consultant which type will work best for you. Breast pumps can be purchased, but some hospitals and breastfeeding support groups lease breast pumps on a monthly basis. A lactation consultant can teach you how to hand express breast milk, if you prefer not to use a pump. Caring for your breasts while you breastfeed Nipples can become dry, cracked, and sore while breastfeeding. The following recommendations can help keep your breasts moisturized and healthy:  Avoid using soap on your nipples.  Wear a supportive bra. Although not required, special nursing bras and tank  tops are designed to allow access to your breasts for breastfeeding without taking off your entire bra or top. Avoid wearing underwire-style bras or extremely tight bras.  Air dry your nipples for 3-4minutes after each feeding.  Use only cotton bra pads to absorb leaked breast milk. Leaking of breast milk between feedings is normal.  Use lanolin on your nipples after breastfeeding. Lanolin helps to maintain your skin's normal moisture barrier. If you use pure lanolin, you do not need to wash it off before feeding your baby again. Pure lanolin is not toxic to your baby. You may also hand express a few drops of breast milk and gently massage that milk into your nipples and allow the milk to air dry.  In the first few weeks after giving birth, some women experience extremely full breasts (engorgement). Engorgement can make your breasts feel heavy, warm, and tender to the touch. Engorgement peaks within 3-5 days after you give birth. The following recommendations can help ease engorgement:  Completely empty your breasts while breastfeeding or pumping. You may want to start by applying warm, moist heat (in the shower or with warm water-soaked hand towels) just before feeding or pumping. This increases circulation and helps the milk flow. If your baby does not completely empty your breasts while breastfeeding, pump any extra milk after he or she is finished.  Wear a snug bra (nursing or regular) or tank top for 1-2 days to signal your body to slightly decrease milk production.  Apply ice packs to your breasts, unless this is too uncomfortable for you.  Make sure that your baby is latched on and positioned properly while breastfeeding.  If engorgement persists after 48 hours of following these recommendations, contact your health care provider or a lactation consultant. Overall health care recommendations while breastfeeding  Eat healthy foods. Alternate between meals and snacks, eating 3 of each per  day. Because what you eat affects your breast milk, some of the foods may make your baby more irritable than usual. Avoid eating these foods if you are sure that they are negatively affecting your baby.  Drink milk, fruit juice, and water to satisfy your thirst (about 10 glasses a day).  Rest often, relax, and continue to take your prenatal vitamins to prevent fatigue, stress, and anemia.  Continue breast self-awareness checks.  Avoid chewing and smoking tobacco. Chemicals from cigarettes that pass into breast milk and exposure to secondhand smoke may harm your baby.  Avoid alcohol and drug use, including marijuana. Some medicines that may be harmful to your baby can pass through breast milk. It is important to ask your health care   provider before taking any medicine, including all over-the-counter and prescription medicine as well as vitamin and herbal supplements. It is possible to become pregnant while breastfeeding. If birth control is desired, ask your health care provider about options that will be safe for your baby. Contact a health care provider if:  You feel like you want to stop breastfeeding or have become frustrated with breastfeeding.  You have painful breasts or nipples.  Your nipples are cracked or bleeding.  Your breasts are red, tender, or warm.  You have a swollen area on either breast.  You have a fever or chills.  You have nausea or vomiting.  You have drainage other than breast milk from your nipples.  Your breasts do not become full before feedings by the fifth day after you give birth.  You feel sad and depressed.  Your baby is too sleepy to eat well.  Your baby is having trouble sleeping.  Your baby is wetting less than 3 diapers in a 24-hour period.  Your baby has less than 3 stools in a 24-hour period.  Your baby's skin or the white part of his or her eyes becomes yellow.  Your baby is not gaining weight by 5 days of age. Get help right away  if:  Your baby is overly tired (lethargic) and does not want to wake up and feed.  Your baby develops an unexplained fever. This information is not intended to replace advice given to you by your health care provider. Make sure you discuss any questions you have with your health care provider. Document Released: 01/10/2005 Document Revised: 06/24/2015 Document Reviewed: 07/04/2012 Elsevier Interactive Patient Education  2017 Elsevier Inc.  

## 2016-05-16 NOTE — Progress Notes (Signed)
    PRENATAL VISIT NOTE  Subjective:  Michelle Scott is a 41 y.o. G8P7 at [redacted]w[redacted]d being seen today for ongoing prenatal care.  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  Patient reports no complaints.  Contractions: Irregular. Vag. Bleeding: None.  Movement: Present. Denies leaking of fluid.   The following portions of the patient's history were reviewed and updated as appropriate: allergies, current medications, past family history, past medical history, past social history, past surgical history and problem list. Problem list updated.  Objective:  There were no vitals filed for this visit.  Fetal Status: Fetal Heart Rate (bpm): NST   Movement: Present  Presentation: Vertex  General:  Alert, oriented and cooperative. Patient is in no acute distress.  Skin: Skin is warm and dry. No rash noted.   Cardiovascular: Normal heart rate noted  Respiratory: Normal respiratory effort, no problems with respiration noted  Abdomen: Soft, gravid, appropriate for gestational age. Pain/Pressure: Present     Pelvic:  Cervical exam performed Dilation: 1 Effacement (%): Thick Station: -3  Extremities: Normal range of motion.  Edema: None  Mental Status: Normal mood and affect. Normal behavior. Normal judgment and thought content.  FBS 88-111 (4 of 9 out of range 2 hour pp 88-145 (8 of 25 out of range) Assessment and Plan:  Pregnancy: G8P7 at [redacted]w[redacted]d  1. Insulin controlled gestational diabetes mellitus (GDM) in third trimester Continue Metformin, add exercise Schedule IOL at 39 wks - Fetal nonstress test  2. Supervision of high risk pregnancy, antepartum Cultures today - Strep Gp B NAA - Cervicovaginal ancillary only  3.  Elderly multigravida in third trimester In 2x/wk testing - Fetal nonstress test  4. Grand multiparity At risk for [facility]  Term labor symptoms and general obstetric precautions including but not limited to vaginal bleeding, contractions, leaking of fluid and fetal movement were reviewed in detail with the patient. Please refer to After Visit Summary for other counseling recommendations.  Return in 1 week (on 05/23/2016) for [facility], OB visit and NST, NST only in 3-4 days.   Reva Bores, MD

## 2016-05-16 NOTE — Progress Notes (Signed)
[employer] interpreter  # [id number] used for encounter.  Pt reports increased pain @ LLQ.  [place] for growth scheduled on 5/3

## 2016-05-17 ENCOUNTER — Telehealth (HOSPITAL_COMMUNITY): Payer: Self-pay | Admitting: *Deleted

## 2016-05-17 LAB — CERVICOVAGINAL ANCILLARY ONLY
Chlamydia: NEGATIVE
Neisseria Gonorrhea: NEGATIVE

## 2016-05-17 NOTE — Telephone Encounter (Signed)
Preadmission screen  

## 2016-05-18 LAB — STREP GP B NAA: Strep Gp B NAA: NEGATIVE

## 2016-05-19 ENCOUNTER — Encounter: Payer: Self-pay | Admitting: *Deleted

## 2016-05-19 ENCOUNTER — Ambulatory Visit: Payer: Self-pay

## 2016-05-19 ENCOUNTER — Ambulatory Visit (INDEPENDENT_AMBULATORY_CARE_PROVIDER_SITE_OTHER): Payer: Medicaid Other | Admitting: Advanced Practice Midwife

## 2016-05-19 DIAGNOSIS — O24414 Gestational diabetes mellitus in pregnancy, insulin controlled: Secondary | ICD-10-CM | POA: Diagnosis present

## 2016-05-19 NOTE — Progress Notes (Signed)
Pt informed that the ultrasound is considered a limited OB ultrasound and is not intended to be a complete ultrasound exam.  Patient also informed that the ultrasound is not being completed with the intent of assessing for fetal or placental anomalies or any pelvic abnormalities.  Explained that the purpose of today's ultrasound is to assess for presentation and amniotic fluid volume.  Patient acknowledges the purpose of the exam and the limitations of the study.    

## 2016-05-19 NOTE — Progress Notes (Signed)
NST Only, reactive   Review of Systems  Physical Exam  Exam

## 2016-05-23 ENCOUNTER — Ambulatory Visit (INDEPENDENT_AMBULATORY_CARE_PROVIDER_SITE_OTHER): Payer: Medicaid Other | Admitting: Obstetrics and Gynecology

## 2016-05-23 VITALS — BP 108/71 | HR 85 | Wt 153.8 lb

## 2016-05-23 DIAGNOSIS — O0993 Supervision of high risk pregnancy, unspecified, third trimester: Secondary | ICD-10-CM

## 2016-05-23 DIAGNOSIS — O09523 Supervision of elderly multigravida, third trimester: Secondary | ICD-10-CM

## 2016-05-23 DIAGNOSIS — O24414 Gestational diabetes mellitus in pregnancy, insulin controlled: Secondary | ICD-10-CM

## 2016-05-23 DIAGNOSIS — Z641 Problems related to multiparity: Secondary | ICD-10-CM

## 2016-05-23 DIAGNOSIS — Z789 Other specified health status: Secondary | ICD-10-CM

## 2016-05-23 DIAGNOSIS — O0990 Supervision of high risk pregnancy, unspecified, unspecified trimester: Secondary | ICD-10-CM

## 2016-05-23 LAB — POCT URINALYSIS DIP (DEVICE)
BILIRUBIN URINE: NEGATIVE
Glucose, UA: NEGATIVE mg/dL
Ketones, ur: NEGATIVE mg/dL
NITRITE: NEGATIVE
PH: 6 (ref 5.0–8.0)
Protein, ur: NEGATIVE mg/dL
Specific Gravity, Urine: 1.02 (ref 1.005–1.030)
UROBILINOGEN UA: 0.2 mg/dL (ref 0.0–1.0)

## 2016-05-23 NOTE — Progress Notes (Signed)
Interpreter # [id number] used for encounter.   [place] for growth scheduled 5/3 @ 1300.  IOL scheduled 5/5 @ 0700.

## 2016-05-23 NOTE — Progress Notes (Signed)
Prenatal Visit Note Date: 05/23/2016 Clinic: [facility]  Subjective:  Michelle Scott is a 41 y.o. G8P7 at [redacted]w[redacted]d being seen today for ongoing prenatal care.  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  Patient reports no complaints.   Contractions: Irregular. Vag. Bleeding: None.  Movement: Present. Denies leaking of fluid.   The following portions of the patient's history were reviewed and updated as appropriate: allergies, current medications, past family history, past medical history, past social history, past surgical history and problem list. Problem list updated.  Objective:   Vitals:   05/23/16 0834  BP: 108/71  Pulse: 85  Weight: 153 lb 12.8 oz (69.8 kg)    Fetal Status: Fetal Heart Rate (bpm): NST   Movement: Present  Presentation: Vertex  General:  Alert, oriented and cooperative. Patient is in no acute distress.  Skin: Skin is warm and dry. No rash noted.   Cardiovascular: Normal heart rate noted  Respiratory: Normal respiratory effort, no problems with respiration noted  Abdomen: Soft, gravid, appropriate for gestational age. Pain/Pressure: Present     Pelvic:  Cervical exam deferred        Extremities: Normal range of motion.  Edema: None  Mental Status: Normal mood and affect. Normal behavior. Normal judgment and thought content.   Urinalysis:      Assessment and Plan:  Pregnancy: G8P7 at [redacted]w[redacted]d  1. GDMa2 Normal BS log on metformin 500/500. 4/12 u/s showed 84%, 3106g, AC >97 and normal AFI last week. rNST today. Has rpt u/s later this week and IOL scheduled for this week. rNST today, rpt later this week.   2. Supervision of high risk pregnancy, antepartum Routine  care. BTL papers signed. Pt undecided on BC  3.Language barrier Interpreter used  term labor symptoms and general obstetric precautions including but not limited to vaginal bleeding, contractions, leaking of fluid and fetal movement were reviewed in detail with the patient. Please refer to After Visit Summary for other counseling recommendations.  Return in about 2 days (around 05/25/2016) for 2-3d nst only visit.   Morrison Bing, MD

## 2016-05-24 ENCOUNTER — Encounter (HOSPITAL_COMMUNITY): Payer: Self-pay

## 2016-05-24 ENCOUNTER — Other Ambulatory Visit: Payer: Self-pay | Admitting: Advanced Practice Midwife

## 2016-05-24 ENCOUNTER — Inpatient Hospital Stay (HOSPITAL_COMMUNITY)
Admission: AD | Admit: 2016-05-24 | Discharge: 2016-05-26 | DRG: 775 | Disposition: A | Discharge status: Home / Self Care | Payer: Medicaid Other | Source: Ambulatory Visit | Attending: Obstetrics and Gynecology | Admitting: Obstetrics and Gynecology

## 2016-05-24 DIAGNOSIS — N90810 Female genital mutilation status, unspecified: Secondary | ICD-10-CM | POA: Diagnosis present

## 2016-05-24 DIAGNOSIS — O0990 Supervision of high risk pregnancy, unspecified, unspecified trimester: Secondary | ICD-10-CM

## 2016-05-24 DIAGNOSIS — Z3A38 38 weeks gestation of pregnancy: Secondary | ICD-10-CM

## 2016-05-24 DIAGNOSIS — Z833 Family history of diabetes mellitus: Secondary | ICD-10-CM

## 2016-05-24 DIAGNOSIS — Z8249 Family history of ischemic heart disease and other diseases of the circulatory system: Secondary | ICD-10-CM

## 2016-05-24 DIAGNOSIS — O3483 Maternal care for other abnormalities of pelvic organs, third trimester: Secondary | ICD-10-CM | POA: Diagnosis present

## 2016-05-24 DIAGNOSIS — O24419 Gestational diabetes mellitus in pregnancy, unspecified control: Secondary | ICD-10-CM | POA: Diagnosis present

## 2016-05-24 DIAGNOSIS — O09523 Supervision of elderly multigravida, third trimester: Secondary | ICD-10-CM

## 2016-05-24 DIAGNOSIS — Z3493 Encounter for supervision of normal pregnancy, unspecified, third trimester: Secondary | ICD-10-CM | POA: Diagnosis present

## 2016-05-24 DIAGNOSIS — O24425 Gestational diabetes mellitus in childbirth, controlled by oral hypoglycemic drugs: Principal | ICD-10-CM | POA: Diagnosis present

## 2016-05-24 DIAGNOSIS — Z641 Problems related to multiparity: Secondary | ICD-10-CM

## 2016-05-24 DIAGNOSIS — O24414 Gestational diabetes mellitus in pregnancy, insulin controlled: Secondary | ICD-10-CM

## 2016-05-24 LAB — CBC
HEMATOCRIT: 34.3 % — AB (ref 36.0–46.0)
Hemoglobin: 11.5 g/dL — ABNORMAL LOW (ref 12.0–15.0)
MCH: 28.5 pg (ref 26.0–34.0)
MCHC: 33.5 g/dL (ref 30.0–36.0)
MCV: 84.9 fL (ref 78.0–100.0)
Platelets: 253 10*3/uL (ref 150–400)
RBC: 4.04 MIL/uL (ref 3.87–5.11)
RDW: 16 % — AB (ref 11.5–15.5)
WBC: 8.9 10*3/uL (ref 4.0–10.5)

## 2016-05-24 LAB — TYPE AND SCREEN
ABO/RH(D): O POS
Antibody Screen: NEGATIVE

## 2016-05-24 LAB — RPR: RPR: NONREACTIVE

## 2016-05-24 LAB — GLUCOSE, CAPILLARY: Glucose-Capillary: 116 mg/dL — ABNORMAL HIGH (ref 65–99)

## 2016-05-24 MED ORDER — OXYTOCIN BOLUS FROM INFUSION
500.0000 mL | Freq: Once | INTRAVENOUS | Status: AC
  Administered 2016-05-24: 500 mL via INTRAVENOUS

## 2016-05-24 MED ORDER — COCONUT OIL OIL: 1.0000 "application " | TOPICAL_OIL | Status: DC | PRN

## 2016-05-24 MED ORDER — ZOLPIDEM TARTRATE 5 MG PO TABS
5.0000 mg | ORAL_TABLET | Freq: Every evening | ORAL | Status: DC | PRN
Start: 2016-05-24 — End: 2016-05-26

## 2016-05-24 MED ORDER — LACTATED RINGERS IV SOLN: INTRAVENOUS | Status: DC

## 2016-05-24 MED ORDER — SENNOSIDES-DOCUSATE SODIUM 8.6-50 MG PO TABS
2.0000 | ORAL_TABLET | ORAL | Status: DC
  Administered 2016-05-24 – 2016-05-25 (×2): 2 via ORAL
  Filled 2016-05-24 (×2): qty 2

## 2016-05-24 MED ORDER — FENTANYL CITRATE (PF) 100 MCG/2ML IJ SOLN: 100.0000 ug | INTRAMUSCULAR | Status: DC | PRN

## 2016-05-24 MED ORDER — OXYCODONE-ACETAMINOPHEN 5-325 MG PO TABS: 1.0000 | ORAL_TABLET | ORAL | Status: DC | PRN

## 2016-05-24 MED ORDER — PRENATAL MULTIVITAMIN CH
1.0000 | ORAL_TABLET | Freq: Every day | ORAL | Status: DC
  Administered 2016-05-24 – 2016-05-25 (×2): 1 via ORAL
  Filled 2016-05-24 (×3): qty 1

## 2016-05-24 MED ORDER — SIMETHICONE 80 MG PO CHEW: 80.0000 mg | CHEWABLE_TABLET | ORAL | Status: DC | PRN

## 2016-05-24 MED ORDER — ONDANSETRON HCL 4 MG PO TABS: 4.0000 mg | ORAL_TABLET | ORAL | Status: DC | PRN

## 2016-05-24 MED ORDER — IBUPROFEN 600 MG PO TABS
600.0000 mg | ORAL_TABLET | Freq: Four times a day (QID) | ORAL | Status: DC
  Administered 2016-05-24 – 2016-05-26 (×8): 600 mg via ORAL
  Filled 2016-05-24 (×9): qty 1

## 2016-05-24 MED ORDER — LIDOCAINE HCL (PF) 1 % IJ SOLN
INTRAMUSCULAR | Status: AC
  Administered 2016-05-24: 30 mL via SUBCUTANEOUS
  Filled 2016-05-24: qty 30

## 2016-05-24 MED ORDER — OXYCODONE HCL 5 MG PO TABS
5.0000 mg | ORAL_TABLET | ORAL | Status: DC | PRN
  Administered 2016-05-24: 5 mg via ORAL
  Filled 2016-05-24: qty 1

## 2016-05-24 MED ORDER — LACTATED RINGERS IV SOLN: 500.0000 mL | INTRAVENOUS | Status: DC | PRN

## 2016-05-24 MED ORDER — WITCH HAZEL-GLYCERIN EX PADS: 1.0000 "application " | MEDICATED_PAD | CUTANEOUS | Status: DC | PRN

## 2016-05-24 MED ORDER — OXYCODONE HCL 5 MG PO TABS: 10.0000 mg | ORAL_TABLET | ORAL | Status: DC | PRN

## 2016-05-24 MED ORDER — METHYLERGONOVINE MALEATE 0.2 MG/ML IJ SOLN: 0.2000 mg | INTRAMUSCULAR | Status: DC | PRN

## 2016-05-24 MED ORDER — MEASLES, MUMPS & RUBELLA VAC ~~LOC~~ INJ
0.5000 mL | INJECTION | Freq: Once | SUBCUTANEOUS | Status: DC
  Filled 2016-05-24: qty 0.5

## 2016-05-24 MED ORDER — BENZOCAINE-MENTHOL 20-0.5 % EX AERO
1.0000 "application " | INHALATION_SPRAY | CUTANEOUS | Status: DC | PRN
  Filled 2016-05-24: qty 56

## 2016-05-24 MED ORDER — OXYTOCIN 40 UNITS IN LACTATED RINGERS INFUSION - SIMPLE MED
INTRAVENOUS | Status: AC
  Administered 2016-05-24: 500 mL via INTRAVENOUS
  Filled 2016-05-24: qty 1000

## 2016-05-24 MED ORDER — TERBUTALINE SULFATE 1 MG/ML IJ SOLN
0.2500 mg | Freq: Once | INTRAMUSCULAR | Status: DC | PRN
  Filled 2016-05-24: qty 1

## 2016-05-24 MED ORDER — LIDOCAINE HCL (PF) 1 % IJ SOLN
30.0000 mL | INTRAMUSCULAR | Status: DC | PRN
  Administered 2016-05-24: 30 mL via SUBCUTANEOUS
  Filled 2016-05-24: qty 30

## 2016-05-24 MED ORDER — ACETAMINOPHEN 325 MG PO TABS
650.0000 mg | ORAL_TABLET | ORAL | Status: DC | PRN
  Administered 2016-05-24: 650 mg via ORAL
  Filled 2016-05-24: qty 2

## 2016-05-24 MED ORDER — OXYCODONE-ACETAMINOPHEN 5-325 MG PO TABS: 2.0000 | ORAL_TABLET | ORAL | Status: DC | PRN

## 2016-05-24 MED ORDER — METHYLERGONOVINE MALEATE 0.2 MG PO TABS: 0.2000 mg | ORAL_TABLET | ORAL | Status: DC | PRN

## 2016-05-24 MED ORDER — DIBUCAINE 1 % RE OINT: 1.0000 "application " | TOPICAL_OINTMENT | RECTAL | Status: DC | PRN

## 2016-05-24 MED ORDER — MISOPROSTOL 25 MCG QUARTER TABLET
25.0000 ug | ORAL_TABLET | ORAL | Status: DC | PRN
  Filled 2016-05-24: qty 1

## 2016-05-24 MED ORDER — ACETAMINOPHEN 325 MG PO TABS: 650.0000 mg | ORAL_TABLET | ORAL | Status: DC | PRN

## 2016-05-24 MED ORDER — FAMOTIDINE 20 MG PO TABS: 40.0000 mg | ORAL_TABLET | Freq: Once | ORAL | Status: DC

## 2016-05-24 MED ORDER — OXYTOCIN 40 UNITS IN LACTATED RINGERS INFUSION - SIMPLE MED
2.5000 [IU]/h | INTRAVENOUS | Status: DC
  Administered 2016-05-24: 2.5 [IU]/h via INTRAVENOUS

## 2016-05-24 MED ORDER — ONDANSETRON HCL 4 MG/2ML IJ SOLN: 4.0000 mg | INTRAMUSCULAR | Status: DC | PRN

## 2016-05-24 MED ORDER — ONDANSETRON HCL 4 MG/2ML IJ SOLN: 4.0000 mg | Freq: Four times a day (QID) | INTRAMUSCULAR | Status: DC | PRN

## 2016-05-24 MED ORDER — DIPHENHYDRAMINE HCL 25 MG PO CAPS: 25.0000 mg | ORAL_CAPSULE | Freq: Four times a day (QID) | ORAL | Status: DC | PRN

## 2016-05-24 MED ORDER — METOCLOPRAMIDE HCL 10 MG PO TABS: 10.0000 mg | ORAL_TABLET | Freq: Once | ORAL | Status: DC

## 2016-05-24 MED ORDER — TETANUS-DIPHTH-ACELL PERTUSSIS 5-2.5-18.5 LF-MCG/0.5 IM SUSP: 0.5000 mL | Freq: Once | INTRAMUSCULAR | Status: DC

## 2016-05-24 MED ORDER — SOD CITRATE-CITRIC ACID 500-334 MG/5ML PO SOLN: 30.0000 mL | ORAL | Status: DC | PRN

## 2016-05-24 NOTE — Progress Notes (Signed)
Dr. Omer Jack called to verify that patient was undecided about BTL tomorrow. She stated she will see in am if patient decides to do it. Consent signed, no epidural in at this time. Arabic live interpreter used for admission teaching, FOB not in room at that time. Interpreter explained that she can be mobile and void on her own if she was not dizzy, no epidural. Emergency button and food menu explained to patient. Patient told about pain medication and told to call nurse for any help she needs. FOB entered the room and does not want [place] to use interpreter while he is there, but I explained to him that we have to use it per policy to make sure everyone understands the information given to them he was ok with that.

## 2016-05-24 NOTE — H&P (Signed)
Michelle Scott is a 41 y.o. female G8P7006 at 38 weeks 3 days presenting for spontaneous onset of labor. Contractions started at 0300. Denies leaking or bleeding. Reports good fetal movement. Her preg has been followed by the [facility] office and has been remarkable for 1) GDMA2 (EFW 84%) 2) grandmultip 3) AMA- elevated risk of DSR 1:169- no genetic counseling 4) female circ  OB History    Gravida Para Term Preterm AB Living   SAB TAB Ectopic Multiple Live Births   0 0 0   7     Past Medical History:  Diagnosis Date  . Anemia   . Gestational diabetes   . UTI (urinary tract infection)    Past Surgical History:  Procedure Laterality Date  . NO PAST SURGERIES     Family History: family history includes Diabetes in her mother; Hypertension in her mother. Social History:  reports that she has never smoked. She has never used smokeless tobacco. She reports that she does not drink alcohol or use drugs.     Maternal Diabetes: Yes:  Diabetes Type:  Insulin/Medication controlled Genetic Screening: Abnormal:  Results: Elevated risk of Trisomy 21 Maternal Ultrasounds/Referrals: Normal Fetal Ultrasounds or other Referrals:  None Maternal Substance Abuse:  No Significant Maternal Medications:  None Significant Maternal Lab Results:  Lab values include: Group B Strep negative Other Comments:  None  Review of Systems  Constitutional: Negative.   Respiratory: Negative.   Cardiovascular: Negative.   Gastrointestinal: Positive for abdominal pain.   Maternal Medical History:  Reason for admission: Contractions.   Contractions: Onset was 3-5 hours ago.   Frequency: regular.   Perceived severity is moderate.    Fetal activity: Perceived fetal activity is normal.   Last perceived fetal movement was within the past hour.    Prenatal complications: no prenatal complications Prenatal Complications - Diabetes: gestational. Diabetes is managed by oral agent (monotherapy).       Dilation: 6 Effacement (%): 50, 60 Station: -2, -3 Exam by:: Danella Deis, RN Last menstrual period 08/29/2015. Maternal Exam:  Uterine Assessment: Contraction strength is moderate.  Contraction frequency is regular.   Abdomen: Patient reports no abdominal tenderness. Estimated fetal weight is 7lbs on 4/12.   Fetal presentation: vertex  Introitus: Normal vulva. Normal vagina.  Ferning test: not done.  Nitrazine test: not done. Amniotic fluid character: clear.  Pelvis: adequate for delivery.   Cervix: Cervix evaluated by digital exam.     Fetal Exam Fetal Monitor Review: Mode: ultrasound.   Baseline rate: 150.  Variability: moderate (6-25 bpm).   Pattern: early decelerations and accelerations present.    Fetal State Assessment: Category II - tracings are indeterminate.     Physical Exam  Nursing note and vitals reviewed. Constitutional: She appears well-developed and well-nourished.  HENT:  Head: Normocephalic and atraumatic.  Eyes: Conjunctivae are normal. No scleral icterus.  Respiratory: Effort normal. No respiratory distress.  GI: Soft. There is no tenderness.  Neurological: She is alert.  Skin: Skin is warm and dry.  Psychiatric: She has a normal mood and affect. Her behavior is normal. Judgment and thought content normal.    Prenatal labs: ABO, Rh: O/Positive/-- (10/12 0000) Antibody: Negative (10/12 0000) Rubella: Immune (10/12 0000) RPR: Nonreactive (02/19 0000)  HBsAg: Negative (10/12 0000)  HIV: Non-reactive (02/19 0000)  GBS: Negative (04/23 0931)   Assessment/Plan: IUP at term. Spontaneous onset of labor. Gestational diabetic, on metformin. GBS neg.  Plan:  Manage expectantly. Anticipate NSVD   Cleone Slim SNM 05/24/2016, 5:45 AM  CNM attestation:  Medrith Veillon is a 41 y.o. [id number] here for SOL  PE: BP 138/77   Pulse 76   Temp 97.8 F (36.6 C) (Axillary)   Resp 18   Ht  (1.626 m)   Wt 70.3 kg (155 lb)   LMP 08/29/2015    Breastfeeding? Unknown   BMI 26.61 kg/m   Resp: normal effort, no distress Abd: gravid  ROS, labs, PMH reviewed  Plan: Admit to [facility] Expectant management Anticipate SVD  Cam Hai CNM 05/24/2016, 8:57 AM

## 2016-05-24 NOTE — MAU Note (Signed)
Patient presents with ctx beginning at 0300.  No VB or LOF.  Reports good FM.  Gestational diabetic-states she was scheduled for u/s on 05/26/16 though unsure reason for u/s.  Patient speaks arabic.

## 2016-05-25 ENCOUNTER — Encounter (HOSPITAL_COMMUNITY)
Admission: AD | Disposition: A | Discharge status: Home / Self Care | Payer: Self-pay | Source: Ambulatory Visit | Attending: Obstetrics and Gynecology

## 2016-05-25 SURGERY — LIGATION, FALLOPIAN TUBE, POSTPARTUM
Anesthesia: Spinal | Laterality: Bilateral

## 2016-05-25 NOTE — Lactation Note (Addendum)
This note was copied from a baby's chart. Lactation Consultation Note Experienced BF mom has 7 other children that she BF for 2 yrs and more for each. Denied difficulty.  Mom BF baby now sitting in bed leaning over to baby in cradle position. Encouraged mom to be comfortable. LC raised the back of the up for comfort.  Mom has pendulum breast, compressible everted nipple. Mom easily latches.  Baby cluster feeding. Educated on cluster feeding, STS, I&O, supply and demand. Encouraged to call for assistance or questions if needed.  Mom encouraged to feed baby 8-12 times/24 hours and with feeding cues. [facility] brochure given w/resources, support groups and LC services. FOB interpreter for English to mom. Denies ned for interpreter other than himself. Patient Name: Michelle Scott Today's Date: 05/25/2016 Reason for consult: Initial assessment   Maternal Data Has patient been taught Hand Expression?: Yes Does the patient have breastfeeding experience prior to this delivery?: Yes  Feeding Feeding Type: Breast Fed Length of feed: 30 min  LATCH Score/Interventions Latch: Grasps breast easily, tongue down, lips flanged, rhythmical sucking.  Audible Swallowing: Spontaneous and intermittent Intervention(s): Alternate breast massage  Type of Nipple: Everted at rest and after stimulation  Comfort (Breast/Nipple): Soft / non-tender     Hold (Positioning): No assistance needed to correctly position infant at breast.  LATCH Score: 10  Lactation Tools Discussed/Used     Consult Status Consult Status: PRN Date: 05/26/16 Follow-up type: In-patient    Charyl Dancer 05/25/2016, 6:37 AM

## 2016-05-25 NOTE — Progress Notes (Signed)
Called to patient bedside to explain postpartum tubal ligation to patient. Procedure described to patient along with risks including infection, bleeding and damage to surrounding organs. Explained to patient that these risks were under 1% but still present. Patietn has decided against PPTL at this time.   Ernestina Penna, MD 05/25/16 9:54 AM

## 2016-05-25 NOTE — Progress Notes (Signed)
Post Partum Day #1 Subjective: no complaints, up ad lib, voiding and tolerating PO  Objective: Blood pressure 101/60, pulse 66, temperature 98.5 F (36.9 C), temperature source Oral, resp. rate 18, height  (1.626 m), weight 70.3 kg (155 lb), last menstrual period 08/29/2015, unknown if currently breastfeeding.  Physical Exam:  General: alert Lochia: appropriate Uterine Fundus: firm DVT Evaluation: No evidence of DVT seen on physical exam.   Recent Labs  05/24/16 0534  HGB 11.5*  HCT 34.3*    Assessment/Plan: Plan for discharge tomorrow  BTL today   LOS: 1 day   Allie Bossier 05/25/2016, 6:49 AM

## 2016-05-25 NOTE — Progress Notes (Signed)
Live Arabic interpreter used to answer questions and explain the PKU testing I did with her infant. Mother informed she can have stronger pain meds if needed, she refused at this time.

## 2016-05-25 NOTE — Progress Notes (Signed)
Patient states she does not want a BTL. Regular diet ordered.

## 2016-05-25 NOTE — Progress Notes (Signed)
Patient states she wants MD to come and explain the BTL to her before she decides if she wants it. There is conflicting information with MD. One on nights stated she didn't want the BTL or she was undecided then another MD stated she wanted it. Will not prep her for OR until I hear a definite answer.

## 2016-05-26 ENCOUNTER — Ambulatory Visit (HOSPITAL_COMMUNITY): Admission: RE | Admit: 2016-05-26 | Payer: Medicaid Other | Source: Ambulatory Visit

## 2016-05-26 NOTE — Discharge Summary (Signed)
OB Discharge Summary  Patient Name: Michelle CapesRabha Carrick DOB: 01/12/1976 MRN: [id number]  Date of admission: 05/24/2016 Delivering MD: Cam HaiSHAW, KIMBERLY D   Date of discharge: 05/26/2016  Admitting diagnosis: 40 WEEKS CTX desires sterilization Intrauterine pregnancy: 7864w3d     Secondary diagnosis:Active Problems:   Gestational diabetes  Additional problems: Grand multipara, AMA     Discharge diagnosis: Term Pregnancy Delivered and GDM A2                                                                      Complications: None  Hospital course:  Onset of Labor With Vaginal Delivery     41 y.o. yo [id number] at 4364w3d was admitted in Active Labor on 05/24/2016. Patient had an uncomplicated labor course as follows:  Membrane Rupture Time/Date: 5:34 AM ,05/24/2016   Intrapartum Procedures: Episiotomy: None [1]                                         Lacerations:  None [1]  Patient had a delivery of a Viable infant. 05/24/2016  Information for the patient's newborn:  Parke Simmersbdalla, Boy Keva [[id number]][[id number]]  Delivery Method: Vaginal, Spontaneous Delivery (Filed from Delivery Summary)    Pateint had an uncomplicated postpartum course.  She is ambulating, tolerating a regular diet, passing flatus, and urinating well. Patient is discharged home in stable condition on 05/26/16.   Physical exam  Vitals:   05/25/16 0540 05/25/16 0759 05/25/16 1900 05/26/16 0619  BP: 101/60 113/67 112/66 108/60  Pulse: 66 81 84 71  Resp: 18 18 18 18   Temp: 98.5 F (36.9 C) 98.2 F (36.8 C) 98.4 F (36.9 C) 98.1 F (36.7 C)  TempSrc: Oral Axillary Axillary Oral  SpO2:   100%   Weight:      Height:       General: alert Lochia: appropriate Uterine Fundus: firm Incision: N/A DVT Evaluation: No evidence of DVT seen on physical exam. Labs: Lab Results  Component Value Date   WBC 8.9 05/24/2016   HGB 11.5 (L) 05/24/2016   HCT 34.3 (L) 05/24/2016   MCV 84.9 05/24/2016   PLT 253 05/24/2016   CMP Latest Ref Rng &  Units 04/02/2016  Glucose 65 - 99 mg/dL 130(Q109(H)  BUN 6 - 20 mg/dL 12  Creatinine 6.570.44 - 8.461.00 mg/dL 9.620.65  Sodium 952135 - 841145 mmol/L 134(L)  Potassium 3.5 - 5.1 mmol/L 3.6  Chloride 101 - 111 mmol/L 104  CO2 22 - 32 mmol/L 27  Calcium 8.9 - 10.3 mg/dL 8.3(L)  Total Protein 6.5 - 8.1 g/dL 6.8  Total Bilirubin 0.3 - 1.2 mg/dL 0.5  Alkaline Phos 38 - 126 U/L 66  AST 15 - 41 U/L 20  ALT 14 - 54 U/L 12(L)    Discharge instruction: per After Visit Summary and "Baby and Me Booklet".  After Visit Meds:  Allergies as of 05/26/2016   No Known Allergies     Medication List    STOP taking these medications   ACCU-CHEK FASTCLIX LANCETS Misc   glucose blood test strip Commonly known as:  ACCU-CHEK GUIDE   metFORMIN 500 MG tablet Commonly  known as:  GLUCOPHAGE       Diet: carb modified diet  Activity: Advance as tolerated. Pelvic rest for 6 weeks.   Outpatient follow up:4 weeks Follow up Appt:Future Appointments Date Time Provider Department Center  07/11/2016 8:20 AM Dorathy Kinsman, CNM [facility]   Follow up visit: No Follow-up on file.  Postpartum contraception: Undecided  Newborn Data: Live born female  Birth Weight: 8 lb 2 oz (3685 g) APGAR: 9, 9  Baby Feeding: bottle and breast Disposition:home with mother   05/26/2016 Allie Bossier, MD

## 2016-05-26 NOTE — Discharge Instructions (Signed)
Contraception Choices °Contraception, also called birth control, means things to use or ways to try not to get pregnant. °Hormonal birth control °This kind of birth control uses hormones. Here are some types of hormonal birth control: °· A tube that is put under skin of the arm (implant). The tube can stay in for as long as 3 years. °· Shots to get every 3 months (injections). °· Pills to take every day (birth control pills). °· A patch to change 1 time each week for 3 weeks (birth control patch). After that, the patch is taken off for 1 week. °· A ring to put in the vagina. The ring is left in for 3 weeks. Then it is taken out of the vagina for 1 week. Then a new ring is put in. °· Pills to take after unprotected sex (emergency birth control pills). ° °Barrier birth control °Here are some types of barrier birth control: °· A thin covering that is put on the penis before sex (female condom). The covering is thrown away after sex. °· A soft, loose covering that is put in the vagina before sex (female condom). The covering is thrown away after sex. °· A rubber bowl that sits over the cervix (diaphragm). The bowl must be made for you. The bowl is put into the vagina before sex. The bowl is left in for 6-8 hours after sex. It is taken out within 24 hours. °· A small, soft cup that fits over the cervix (cervical cap). The cup must be made for you. The cup can be left in for 6-8 hours after sex. It is taken out within 48 hours. °· A sponge that is put into the vagina before sex. It must be left in for at least 6 hours after sex. It must be taken out within 30 hours. Then it is thrown away. °· A chemical that kills or stops sperm from getting into the uterus (spermicide). It may be a pill, cream, jelly, or foam to put in the vagina. The chemical should be used at least 10-15 minutes before sex. ° °IUD (intrauterine) birth control °An IUD is a small, T-shaped piece of plastic. It is put inside the uterus. There are two  kinds: °· Hormone IUD. This kind can stay in for 3-5 years. °· Copper IUD. This kind can stay in for 10 years. ° °Permanent birth control °Here are some types of permanent birth control: °· Surgery to block the fallopian tubes. °· Having an insert put into each fallopian tube. °· Surgery to tie off the tubes that carry sperm (vasectomy). ° °Natural planning birth control °Here are some types of natural planning birth control: °· Not having sex on the days the woman could get pregnant. °· Using a calendar: °? To keep track of the length of each period. °? To find out what days pregnancy can happen. °? To plan to not have sex on days when pregnancy can happen. °· Watching for symptoms of ovulation and not having sex during ovulation. One way the woman can check for ovulation is to check her temperature. °· Waiting to have sex until after ovulation. ° °Summary °· Contraception, also called birth control, means things to use or ways to try not to get pregnant. °· Hormonal methods of birth control include implants, injections, pills, patches, vaginal rings, and emergency birth control pills. °· Barrier methods of birth control can include female condoms, female condoms, diaphragms, cervical caps, sponges, and spermicides. °· There are two types of   IUD (intrauterine device) birth control. An IUD can be put in a woman's uterus to prevent pregnancy for 3-5 years. °· Permanent sterilization can be done through a procedure for males, females, or both. °· Natural planning methods involve not having sex on the days when the woman could get pregnant. °This information is not intended to replace advice given to you by your health care provider. Make sure you discuss any questions you have with your health care provider. °Document Released: 11/07/2008 Document Revised: 01/21/2016 Document Reviewed: 01/21/2016 °Elsevier Interactive Patient Education © 2017 Else[employer] Inc. °Home Care Instructions for Mom °ACTIVITY °· Gradually return to  your regular activities. °· Let yourself rest. Nap while your baby sleeps. °· Avoid lifting anything that is heavier than 10 lb (4.5 kg) until your health care provider says it is okay. °· Avoid activities that take a lot of effort and energy (are strenuous) until approved by your health care provider. Walking at a slow-to-moderate pace is usually safe. °· If you had a cesarean delivery: °? Do not vacuum, climb stairs, or drive a car for 4-6 weeks. °? Have someone help you at home until you feel like you can do your usual activities yourself. °? Do exercises as told by your health care provider, if this applies. ° °VAGINAL BLEEDING °You may continue to bleed for 4-6 weeks after delivery. Over time, the amount of blood usually decreases and the color of the blood usually gets lighter. However, the flow of bright red blood may increase if you have been too active. If you need to use more than one pad in an hour because your pad gets soaked, or if you pass a large clot: °· Lie down. °· Raise your feet. °· Place a cold compress on your lower abdomen. °· Rest. °· Call your health care provider. ° °If you are breastfeeding, your period should return anytime between 8 weeks after delivery and the time that you stop breastfeeding. If you are not breastfeeding, your period should return 6-8 weeks after delivery. °PERINEAL CARE °The perineal area, or perineum, is the part of your body between your thighs. After delivery, this area needs special care. Follow these instructions as told by your health care provider. °· Take warm tub baths for 15-20 minutes. °· Use medicated pads and pain-relieving sprays and creams as told. °· Do not use tampons or douches until vaginal bleeding has stopped. °· Each time you go to the bathroom: °? Use a peri bottle. °? Change your pad. °? Use towelettes in place of toilet paper until your stitches have healed. °· Do Kegel exercises every day. Kegel exercises help to maintain the muscles that  support the vagina, bladder, and bowels. You can do these exercises while you are standing, sitting, or lying down. To do Kegel exercises: °? Tighten the muscles of your abdomen and the muscles that surround your birth canal. °? Hold for a few seconds. °? Relax. °? Repeat until you have done this 5 times in a row. °· To prevent hemorrhoids from developing or getting worse: °? Drink enough fluid to keep your urine clear or pale yellow. °? Avoid straining when having a bowel movement. °? Take over-the-counter medicines and stool softeners as told by your health care provider. ° °BREAST CARE °· Wear a tight-fitting bra. °· Avoid taking over-the-counter pain medicine for breast discomfort. °· Apply ice to the breasts to help with discomfort as needed: °? Put ice in a plastic bag. °? Place a towel between your   skin and the bag. °? Leave the ice on for 20 minutes or as told by your health care provider. ° °NUTRITION °· Eat a well-balanced diet. °· Do not try to lose weight quickly by cutting back on calories. °· Take your prenatal vitamins until your postpartum checkup or until your health care provider tells you to stop. ° °POSTPARTUM DEPRESSION °You may find yourself crying for no apparent reason and unable to cope with all of the changes that come with having a newborn. This mood is called postpartum depression. Postpartum depression happens because your hormone levels change after delivery. If you have postpartum depression, get support from your partner, friends, and family. If the depression does not go away on its own after several weeks, contact your health care provider. °BREAST SELF-EXAM °Do a breast self-exam each month, at the same time of the month. If you are breastfeeding, check your breasts just after a feeding, when your breasts are less full. If you are breastfeeding and your period has started, check your breasts on day 5, 6, or 7 of your period. °Report any lumps, bumps, or discharge to your health  care provider. Know that breasts are normally lumpy if you are breastfeeding. This is temporary, and it is not a health risk. °INTIMACY AND SEXUALITY °Avoid sexual activity for at least 3-4 weeks after delivery or until the brownish-red vaginal flow is completely gone. If you want to avoid pregnancy, use some form of birth control. You can get pregnant after delivery, even if you have not had your period. °SEEK MEDICAL CARE IF: °· You feel unable to cope with the changes that a child brings to your life, and these feelings do not go away after several weeks. °· You notice a lump, a bump, or discharge on your breast. ° °SEEK IMMEDIATE MEDICAL CARE IF: °· Blood soaks your pad in 1 hour or less. °· You have: °? Severe pain or cramping in your lower abdomen. °? A bad-smelling vaginal discharge. °? A fever that is not controlled by medicine. °? A fever, and an area of your breast is red and sore. °? Pain or redness in your calf. °? Sudden, severe chest pain. °? Shortness of breath. °? Painful or bloody urination. °? Problems with your vision. °· You vomit for 12 hours or longer. °· You develop a severe headache. °· You have serious thoughts about hurting yourself, your child, or anyone else. ° °This information is not intended to replace advice given to you by your health care provider. Make sure you discuss any questions you have with your health care provider. °Document Released: 01/08/2000 Document Revised: 06/18/2015 Document Reviewed: 07/14/2014 °Elsevier Interactive Patient Education © 2017 Elsevier Inc. ° °

## 2016-05-26 NOTE — Lactation Note (Signed)
This note was copied from a baby's chart. Lactation Consultation Note Mom sleeping. Baby had 7% weight loss at 42 hrs of age. Note feeding times needs to increase as well as output. In that time frame had 5 stools and 1 stools documented.  Will suggest LC to f/u today. This is mom's 8th baby. When talked to FOB first visit FOB stated she has know what to do for a long time with all the children she has fed.  FOB is interpreter.  Patient Name: Michelle Nyra CapesRabha Scott ZOXWR'UToday's Date: 05/26/2016     Maternal Data    Feeding    LATCH Score/Interventions                      Lactation Tools Discussed/Used     Consult Status      Charyl DancerCARVER, Michelle Scott G 05/26/2016, 5:56 AM

## 2016-05-26 NOTE — Lactation Note (Signed)
This note was copied from a baby's chart. Lactation Consultation Note  Patient Name: Michelle Scott Today's Date: 05/26/2016 Reason for consult: Follow-up assessment;Infant weight loss - 39 hours Bili check 5.3  Arabic interpreter - [phone] - Amhe and also dad speaks fluent English ( and responded appropriately with questions and answers to questions.  LC reviewed doc flow sheets, and received updates from dad.  Per dad baby recently fed.  Per dad mentioned they have changed 2 large black stools diapers and many wets since birth , could not remember the time.  Per mom nipple are alittle sore . LC assessed breast tissue , no breakdown noted. Reviewed hand expressing ,large drops of milk noted and had mom apply them to her nipples.  Also instructed mom on the use hand pump , #24 flange ok for today , and #27 Flange provided for when the milk comes in.  Sore nipple and engorgement reviewed. Ask mom if she had problems in the past and she said no , she just feeds the baby often.  LC stressed the importance due to the 7 % weight loss to feed the baby with feeding cues and not to go over 3 hours without feeding the baby. Until the weight is back up to birth weight.  Skin to skin feedings until the baby can stay awake for a feeding.  Mother informed of post-discharge support and given phone number to the lactation department, including services for phone call assistance; out-patient appointments; and breastfeeding support group. List of other breastfeeding resources in the community given in the handout. Encouraged mother to call for problems or concerns related to breastfeeding. Extra diary sheets given to mom .      Maternal Data Has patient been taught Hand Expression?: Yes  Feeding Feeding Type:  (recently breastfed baby sleeping ) Length of feed: 45 min  LATCH Score/Interventions          Comfort (Breast/Nipple):  (per mom nipples tender )  Problem noted: Filling;Mild/Moderate  discomfort        Lactation Tools Discussed/Used Tools: Pump;Flanges (instructed mom on the use of hand pump , #24 Flange ok for today , and #27 given for when milk comes in ) Flange Size: 27 Breast pump type: Manual Pump Review: Milk Storage   Consult Status Consult Status: Complete Date: 05/26/16 Follow-up type: In-patient    Matilde SprangMargaret Ann Myesha Stillion 05/26/2016, 12:11 PM

## 2016-05-28 ENCOUNTER — Inpatient Hospital Stay (HOSPITAL_COMMUNITY): Admission: RE | Admit: 2016-05-28 | Payer: Medicaid Other | Source: Ambulatory Visit

## 2016-07-11 ENCOUNTER — Ambulatory Visit (INDEPENDENT_AMBULATORY_CARE_PROVIDER_SITE_OTHER): Payer: Medicaid Other | Admitting: Advanced Practice Midwife

## 2016-07-11 ENCOUNTER — Encounter: Payer: Self-pay | Admitting: Advanced Practice Midwife

## 2016-07-11 VITALS — BP 108/74 | HR 74 | Wt 146.1 lb

## 2016-07-11 DIAGNOSIS — O24419 Gestational diabetes mellitus in pregnancy, unspecified control: Secondary | ICD-10-CM

## 2016-07-11 DIAGNOSIS — R1032 Left lower quadrant pain: Secondary | ICD-10-CM

## 2016-07-11 DIAGNOSIS — Z30011 Encounter for initial prescription of contraceptive pills: Secondary | ICD-10-CM

## 2016-07-11 DIAGNOSIS — Z3202 Encounter for pregnancy test, result negative: Secondary | ICD-10-CM

## 2016-07-11 LAB — POCT PREGNANCY, URINE: Preg Test, Ur: NEGATIVE

## 2016-07-11 MED ORDER — NORETHINDRONE 0.35 MG PO TABS: 1.0000 | ORAL_TABLET | Freq: Every day | ORAL | 12 refills | Status: DC

## 2016-07-11 NOTE — Patient Instructions (Addendum)
Benadryl Hydrocortisone Oatmeal bath   Contact Dermatitis Dermatitis is redness, soreness, and swelling (inflammation) of the skin. Contact dermatitis is a reaction to certain substances that touch the skin. There are two types of contact dermatitis:  Irritant contact dermatitis. This type is caused by something that irritates your skin, such as dry hands from washing them too much. This type does not require previous exposure to the substance for a reaction to occur. This type is more common.  Allergic contact dermatitis. This type is caused by a substance that you are allergic to, such as a nickel allergy or poison ivy. This type only occurs if you have been exposed to the substance (allergen) before. Upon a repeat exposure, your body reacts to the substance. This type is less common.  What are the causes? Many different substances can cause contact dermatitis. Irritant contact dermatitis is most commonly caused by exposure to:  Makeup.  Soaps.  Detergents.  Bleaches.  Acids.  Metal salts, such as nickel.  Allergic contact dermatitis is most commonly caused by exposure to:  Poisonous plants.  Chemicals.  Jewelry.  Latex.  Medicines.  Preservatives in products, such as clothing.  What increases the risk? This condition is more likely to develop in:  People who have jobs that expose them to irritants or allergens.  People who have certain medical conditions, such as asthma or eczema.  What are the signs or symptoms? Symptoms of this condition may occur anywhere on your body where the irritant has touched you or is touched by you. Symptoms include:  Dryness or flaking.  Redness.  Cracks.  Itching.  Pain or a burning feeling.  Blisters.  Drainage of small amounts of blood or clear fluid from skin cracks.  With allergic contact dermatitis, there may also be swelling in areas such as the eyelids, mouth, or genitals. How is this diagnosed? This condition  is diagnosed with a medical history and physical exam. A patch skin test may be performed to help determine the cause. If the condition is related to your job, you may need to see an occupational medicine specialist. How is this treated? Treatment for this condition includes figuring out what caused the reaction and protecting your skin from further contact. Treatment may also include:  Steroid creams or ointments. Oral steroid medicines may be needed in more severe cases.  Antibiotics or antibacterial ointments, if a skin infection is present.  Antihistamine lotion or an antihistamine taken by mouth to ease itching.  A bandage (dressing).  Follow these instructions at home: Skin Care  Moisturize your skin as needed.  Apply cool compresses to the affected areas.  Try taking a bath with: ? Epsom salts. Follow the instructions on the packaging. You can get these at your local pharmacy or grocery store. ? Baking soda. Pour a small amount into the bath as directed by your health care provider. ? Colloidal oatmeal. Follow the instructions on the packaging. You can get this at your local pharmacy or grocery store.  Try applying baking soda paste to your skin. Stir water into baking soda until it reaches a paste-like consistency.  Do not scratch your skin.  Bathe less frequently, such as every other day.  Bathe in lukewarm water. Avoid using hot water. Medicines  Take or apply over-the-counter and prescription medicines only as told by your health care provider.  If you were prescribed an antibiotic medicine, take or apply your antibiotic as told by your health care provider. Do not stop using  the antibiotic even if your condition starts to improve. General instructions  Keep all follow-up visits as told by your health care provider. This is important.  Avoid the substance that caused your reaction. If you do not know what caused it, keep a journal to try to track what caused it.  Write down: ? What you eat. ? What cosmetic products you use. ? What you drink. ? What you wear in the affected area. This includes jewelry.  If you were given a dressing, take care of it as told by your health care provider. This includes when to change and remove it. Contact a health care provider if:  Your condition does not improve with treatment.  Your condition gets worse.  You have signs of infection such as swelling, tenderness, redness, soreness, or warmth in the affected area.  You have a fever.  You have new symptoms. Get help right away if:  You have a severe headache, neck pain, or neck stiffness.  You vomit.  You feel very sleepy.  You notice red streaks coming from the affected area.  Your bone or joint underneath the affected area becomes painful after the skin has healed.  The affected area turns darker.  You have difficulty breathing. This information is not intended to replace advice given to you by your health care provider. Make sure you discuss any questions you have with your health care provider. Document Released: 01/08/2000 Document Revised: 06/18/2015 Document Reviewed: 05/28/2014 Elsevier Interactive Patient Education  2018 [employer]

## 2016-07-11 NOTE — Progress Notes (Signed)
Subjective:     Michelle Scott is a 41 y.o. female who presents for a postpartum visit. She is 6 weeks postpartum following a spontaneous vaginal delivery. I have fully reviewed the prenatal and intrapartum course. The delivery was at 38.3 gestational weeks. Outcome: spontaneous vaginal delivery. Anesthesia: none. Postpartum course has been complicated by ongoing LLQ pain that started in pregnancy. Pain is intermittent, sharp, improving, mild-mod. Baby's course has been Uncomplicated. Baby is feeding by breast. Bleeding no bleeding. Bowel function is normal. Bladder function is normal. Patient is not sexually active. Contraception method is none. Postpartum depression screening: negative.  The following portions of the patient's history were reviewed and updated as appropriate: allergies, current medications, past family history, past medical history, past social history, past surgical history and problem list.  Last Pap 2017, Nml.  Nml adnexa on OB US's performed after LLQ pain started.   Video interpreter used.   Review of Systems Pertinent items are noted in HPI.   Also C/O itching over entire body for the past few weeks. Small rash on right forearm .  Objective:    BP 108/74   Pulse 74   Wt 146 lb 1.6 oz (66.3 kg)   BMI 25.08 kg/m   General:  alert, cooperative, appears stated age and no distress   Breasts:  declined  Lungs: clear to auscultation bilaterally  Heart:  regular rate and rhythm, S1, S2 normal, no murmur, click, rub or gallop  Abdomen: soft, non-tender; bowel sounds normal; no masses,  no organomegaly   Vulva:  Well-healing reapproximated female circumcision  Vagina: normal vagina, no discharge, exudate, lesion, or erythema  Cervix:  not evalualted   Corpus: not examined  Adnexa:  no mass, fullness, tenderness  Rectal Exam: Not performed.        Assessment:   - Nml postpartum exam. Pap smear not done at today's visit.  - LLQ pain either MS or GI. Will check CBC w/  dif. May need to F/U w/ PCP if no resolution. No evidence of Ob/Gyn etiology.  - Pruritis. Tx w/ Cortisone cream of Benadryl PRN.    Plan:    1. Contraception: oral progesterone-only contraceptive 2. 2 hour GTT today 3. Follow up in: 1 year or as needed.   4. Pap due 2020

## 2016-07-12 LAB — GLUCOSE TOLERANCE, 2 HOURS
Glucose, 2 hour: 109 mg/dL (ref 65–139)
Glucose, GTT - Fasting: 94 mg/dL (ref 65–99)

## 2016-07-12 LAB — CBC WITH DIFFERENTIAL/PLATELET
BASOS: 0 %
Basophils Absolute: 0 10*3/uL (ref 0.0–0.2)
EOS (ABSOLUTE): 0.1 10*3/uL (ref 0.0–0.4)
EOS: 2 %
HEMATOCRIT: 38.9 % (ref 34.0–46.6)
HEMOGLOBIN: 12.2 g/dL (ref 11.1–15.9)
IMMATURE GRANS (ABS): 0 10*3/uL (ref 0.0–0.1)
IMMATURE GRANULOCYTES: 0 %
LYMPHS: 41 %
Lymphocytes Absolute: 2.6 10*3/uL (ref 0.7–3.1)
MCH: 28 pg (ref 26.6–33.0)
MCHC: 31.4 g/dL — ABNORMAL LOW (ref 31.5–35.7)
MCV: 89 fL (ref 79–97)
MONOCYTES: 7 %
Monocytes Absolute: 0.4 10*3/uL (ref 0.1–0.9)
NEUTROS ABS: 3.2 10*3/uL (ref 1.4–7.0)
NEUTROS PCT: 50 %
PLATELETS: 329 10*3/uL (ref 150–379)
RBC: 4.35 x10E6/uL (ref 3.77–5.28)
RDW: 15.4 % (ref 12.3–15.4)
WBC: 6.3 10*3/uL (ref 3.4–10.8)

## 2016-07-22 ENCOUNTER — Telehealth: Payer: Self-pay | Admitting: *Deleted

## 2016-07-22 NOTE — Telephone Encounter (Signed)
Patient called and requested results of 2 hr gtt.

## 2016-07-22 NOTE — Telephone Encounter (Signed)
Called patient with pacific interpreter [phone], no answer- left message stating we are trying to reach her to return her phone call. Her recent test results are normal. She may call us back if she has questions

## 2016-11-12 ENCOUNTER — Emergency Department (HOSPITAL_COMMUNITY): Payer: Self-pay

## 2016-11-12 ENCOUNTER — Emergency Department (HOSPITAL_COMMUNITY)
Admission: EM | Admit: 2016-11-12 | Discharge: 2016-11-12 | Disposition: A | Discharge status: Home / Self Care | Payer: Self-pay | Attending: Emergency Medicine | Admitting: Emergency Medicine

## 2016-11-12 DIAGNOSIS — R109 Unspecified abdominal pain: Secondary | ICD-10-CM | POA: Insufficient documentation

## 2016-11-12 DIAGNOSIS — Z79899 Other long term (current) drug therapy: Secondary | ICD-10-CM | POA: Insufficient documentation

## 2016-11-12 DIAGNOSIS — R0789 Other chest pain: Secondary | ICD-10-CM | POA: Insufficient documentation

## 2016-11-12 LAB — URINALYSIS, ROUTINE W REFLEX MICROSCOPIC
Bilirubin Urine: NEGATIVE
GLUCOSE, UA: NEGATIVE mg/dL
HGB URINE DIPSTICK: NEGATIVE
KETONES UR: NEGATIVE mg/dL
NITRITE: NEGATIVE
PROTEIN: NEGATIVE mg/dL
Specific Gravity, Urine: 1.015 (ref 1.005–1.030)
pH: 6 (ref 5.0–8.0)

## 2016-11-12 LAB — I-STAT TROPONIN, ED
TROPONIN I, POC: 0 ng/mL (ref 0.00–0.08)
Troponin i, poc: 0 ng/mL (ref 0.00–0.08)

## 2016-11-12 LAB — BASIC METABOLIC PANEL
Anion gap: 9 (ref 5–15)
BUN: 10 mg/dL (ref 6–20)
CHLORIDE: 100 mmol/L — AB (ref 101–111)
CO2: 25 mmol/L (ref 22–32)
CREATININE: 0.63 mg/dL (ref 0.44–1.00)
Calcium: 9.1 mg/dL (ref 8.9–10.3)
GFR calc Af Amer: >60 mL/min (ref >60)
GFR calc non Af Amer: >60 mL/min (ref >60)
Glucose, Bld: 91 mg/dL (ref 65–99)
POTASSIUM: 3.8 mmol/L (ref 3.5–5.1)
Sodium: 134 mmol/L — ABNORMAL LOW (ref 135–145)

## 2016-11-12 LAB — CBC
HEMATOCRIT: 35.9 % — AB (ref 36.0–46.0)
Hemoglobin: 11.9 g/dL — ABNORMAL LOW (ref 12.0–15.0)
MCH: 28 pg (ref 26.0–34.0)
MCHC: 33.1 g/dL (ref 30.0–36.0)
MCV: 84.5 fL (ref 78.0–100.0)
PLATELETS: 347 10*3/uL (ref 150–400)
RBC: 4.25 MIL/uL (ref 3.87–5.11)
RDW: 13.6 % (ref 11.5–15.5)
WBC: 8.2 10*3/uL (ref 4.0–10.5)

## 2016-11-12 LAB — LIPASE, BLOOD: Lipase: 39 U/L (ref 11–51)

## 2016-11-12 MED ORDER — ONDANSETRON 4 MG PO TBDP: 4.0000 mg | ORAL_TABLET | Freq: Three times a day (TID) | ORAL | 0 refills | Status: DC | PRN

## 2016-11-12 MED ORDER — ACETAMINOPHEN 325 MG PO TABS
650.0000 mg | ORAL_TABLET | Freq: Once | ORAL | Status: AC
  Administered 2016-11-12: 650 mg via ORAL
  Filled 2016-11-12: qty 2

## 2016-11-12 MED ORDER — SODIUM CHLORIDE 0.9 % IV BOLUS (SEPSIS)
1000.0000 mL | Freq: Once | INTRAVENOUS | Status: AC
  Administered 2016-11-12: 1000 mL via INTRAVENOUS

## 2016-11-12 NOTE — ED Triage Notes (Signed)
Pt states 2 weeks of chest pain, got worse yesterday. Recent pregnancy. Left sided chest pain radiating into left back.

## 2016-11-12 NOTE — ED Provider Notes (Signed)
[facility] Provider Note   CSN: [id number] Arrival date & time: 11/12/16  1833     History   Chief Complaint Chief Complaint  Patient presents with  . Chest Pain    HPI Michelle Scott is a 41 y.o. female  With no significant past medical history, who presents to ED for evaluation of 2 day history of left-sided chest pain. She states that the pain radiates to her abdomen. Describes the pain as sharp and rates it at 8/10. She reports intermittent nausea as well. She states that the pain has been constant for the whole day today. She denies any previous history of similar symptoms. She denies any shortness of breath, hemoptysis, leg swelling, prior MI, DVT, PE, recent surgery, recent prolonged travel, OCP use.  HPI  Past Medical History:  Diagnosis Date  . Anemia   . Gestational diabetes   . UTI (urinary tract infection)     Patient Active Problem List   Diagnosis Date Noted  . Gestational diabetes 04/01/2016  . 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 Language barrier 10/15/2015  . MALARIA, HX OF 05/04/2009    Past Surgical History:  Procedure Laterality Date  . NO PAST SURGERIES      OB History    Gravida Para Term Preterm AB Living   8 8 8     7    SAB TAB Ectopic Multiple Live Births   0 0 0 0 8       Home Medications    Prior to Admission medications   Medication Sig Start Date End Date Taking? Authorizing Provider  norethindrone (CAMILA) 0.35 MG tablet Take 1 tablet (0.35 mg total) by mouth daily. Patient not taking: Reported on 11/12/2016 07/11/16   Katrinka BlazingSmith, [place], CNM  ondansetron (ZOFRAN ODT) 4 MG disintegrating tablet Take 1 tablet (4 mg total) by mouth every 8 (eight) hours as needed for nausea or vomiting. 11/12/16   Dietrich PatesKhatri, Brayden Betters, PA-C    Family History Family History  Problem Relation Age of Onset  . Hypertension Mother   . Diabetes Mother     Social History Social History  Substance Use Topics  .  Smoking status: Never Smoker  . Smokeless tobacco: Never Used  . Alcohol use No     Allergies   Patient has no known allergies.   Review of Systems Review of Systems  Constitutional: Negative for appetite change, chills and fever.  HENT: Negative for ear pain, rhinorrhea, sneezing and sore throat.   Eyes: Negative for photophobia and visual disturbance.  Respiratory: Negative for cough, chest tightness, shortness of breath and wheezing.   Cardiovascular: Positive for chest pain. Negative for palpitations.  Gastrointestinal: Positive for abdominal pain and nausea. Negative for blood in stool, constipation, diarrhea and vomiting.  Genitourinary: Negative for dysuria, hematuria and urgency.  Musculoskeletal: Negative for myalgias.  Skin: Negative for rash.  Neurological: Negative for dizziness, weakness and light-headedness.     Physical Exam Updated Vital Signs BP 131/80   Pulse 67   Temp 98.2 F (36.8 C) (Oral)   Resp 15   SpO2 100%   Physical Exam  Constitutional: She appears well-developed and well-nourished. No distress.  HENT:  Head: Normocephalic and atraumatic.  Nose: Nose normal.  Eyes: Conjunctivae and EOM are normal. Left eye exhibits no discharge. No scleral icterus.  Neck: Normal range of motion. Neck supple.  Cardiovascular: Normal rate, regular rhythm, normal heart sounds and intact distal pulses.  Exam reveals no gallop and no  friction rub.   No murmur heard. Pulmonary/Chest: Effort normal and breath sounds normal. No respiratory distress.    Abdominal: Soft. Bowel sounds are normal. She exhibits no distension. There is no tenderness. There is no guarding.  Musculoskeletal: Normal range of motion. She exhibits no edema.  Neurological: She is alert. She exhibits normal muscle tone. Coordination normal.  Skin: Skin is warm and dry. No rash noted.  Psychiatric: She has a normal mood and affect.  Nursing note and vitals reviewed.    ED Treatments /  Results  Labs (all labs ordered are listed, but only abnormal results are displayed) Labs Reviewed  BASIC METABOLIC PANEL - Abnormal; Notable for the following:       Result Value   Sodium 134 (*)    Chloride 100 (*)    All other components within normal limits  CBC - Abnormal; Notable for the following:    Hemoglobin 11.9 (*)    HCT 35.9 (*)    All other components within normal limits  URINALYSIS, ROUTINE W REFLEX MICROSCOPIC - Abnormal; Notable for the following:    APPearance HAZY (*)    Leukocytes, UA MODERATE (*)    Bacteria, UA RARE (*)    Squamous Epithelial / LPF 0-5 (*)    All other components within normal limits  URINE CULTURE  LIPASE, BLOOD  I-STAT TROPONIN, ED  POC URINE PREG, ED  I-STAT TROPONIN, ED    EKG  EKG Interpretation  Date/Time:  Saturday November 12 2016 18:42:51 EDT Ventricular Rate:  84 PR Interval:  106 QRS Duration: 90 QT Interval:  342 QTC Calculation: 404 R Axis:   40 Text Interpretation:  Sinus rhythm with short PR Nonspecific T wave abnormality , new in V2 since last tracing Abnormal ECG Otherwise no significant change Confirmed by Drema Pry [phone]) on 11/12/2016 6:46:51 PM       Radiology Dg Chest 2 View  Result Date: 11/12/2016 CLINICAL DATA:  Chest pain. EXAM: CHEST  2 VIEW COMPARISON:  None. FINDINGS: The heart size and mediastinal contours are within normal limits. Both lungs are clear. The visualized skeletal structures are unremarkable. IMPRESSION: No active cardiopulmonary disease. Electronically Signed   By: Gerome Sam III M.D   On: 11/12/2016 19:55   Dg Abdomen 1 View  Result Date: 11/12/2016 CLINICAL DATA:  41 y/o  F; 2 weeks of chest pain. EXAM: ABDOMEN - 1 VIEW COMPARISON:  None. FINDINGS: The bowel gas pattern is normal. No radio-opaque calculi or other significant radiographic abnormality are seen. 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 IMPRESSION: Normal bowel gas pattern.  Electronically Signed   By: Mitzi Hansen M.D.   On: 11/12/2016 23:12    Procedures Procedures (including critical care time)  Medications Ordered in ED Medications  sodium chloride 0.9 % bolus 1,000 mL (1,000 mLs Intravenous New Bag/Given 11/12/16 2241)  acetaminophen (TYLENOL) tablet 650 mg (650 mg Oral Given 11/12/16 2241)     Initial Impression / Assessment and Plan / ED Course  I have reviewed the triage vital signs and the nursing notes.  Pertinent labs & imaging results that were available during my care of the patient were reviewed by me and considered in my medical decision making (see chart for details).     Patient presents to ED for evaluation of today history of left-sided chest pain radiating to her abdomen. She reports intermittent nausea. Pain has been constant for the whole day today. She denies any cardiac risk factors including prior  MI, DVT, PE, recent surgery, recent prolonged travel, OCP use, shortness of breath. On physical exam she is nontoxic-appearing and in no acute distress. She does have tenderness to palpation of the chest as indicated in the image above. She also has some associated abdominal tenderness to palpation. Heart rate and oxygen saturations normal. She is afebrile with no history of fever. labwork including BMP, CBC unremarkable. Troponin negative 1. Urinalysis with moderate leuks and rare bacteria. Lipase unremarkable. Urine pregnancy negative. Delta troponin negative. Urine sent for culture. Chest x-ray returned as negative for acute abnormality. Abdominal x-ray returned as negative. EKG with tracing similar to previous. Patient given Tylenol here in the ED with complete resolution of her symptoms. I suspect that her symptoms are all due to a musculoskeletal pain. I have low suspicion for cardiac cause or pulmonary cause of her chest pain based on the reproducibility, symptoms and lab work. I will suspicion for acute intra-abdominal  abnormality or surgical abdomen been the cause of her pain. Patient is breast-feeding so we'll advise Tylenol to be taken as needed for pain. Patient given Zofran to be taken when necessary for nausea she states that this has helped her in the past. patientappears stable for discharge at this time. Strict return precautions given.  Final Clinical Impressions(s) / ED Diagnoses   Final diagnoses:  Chest wall pain    New Prescriptions New Prescriptions   ONDANSETRON (ZOFRAN ODT) 4 MG DISINTEGRATING TABLET    Take 1 tablet (4 mg total) by mouth every 8 (eight) hours as needed for nausea or vomiting.     Dietrich Pates, PA-C 11/12/16 2344    Nira Conn, MD 11/13/16 Jacinta Shoe

## 2016-11-12 NOTE — ED Notes (Signed)
Pt stable, ambulatory, states understanding of discharge instructions, family at bedside. 

## 2016-11-12 NOTE — Discharge Instructions (Signed)
Please read attached information regarding her condition. Take Tylenol as needed for pain. Take Zofran as needed for nausea. [facility]. Return to ED for worsening chest pain, trouble breathing, increased vomiting, lightheadedness or loss of consciousness.

## 2016-11-14 LAB — URINE CULTURE

## 2016-11-14 LAB — POC URINE PREG, ED: PREG TEST UR: NEGATIVE

## 2017-04-13 ENCOUNTER — Encounter (HOSPITAL_COMMUNITY): Payer: Self-pay

## 2017-04-13 ENCOUNTER — Emergency Department (HOSPITAL_COMMUNITY)
Admission: EM | Admit: 2017-04-13 | Discharge: 2017-04-14 | Disposition: A | Discharge status: Home / Self Care | Payer: Self-pay | Attending: Emergency Medicine | Admitting: Emergency Medicine

## 2017-04-13 ENCOUNTER — Other Ambulatory Visit: Payer: Self-pay

## 2017-04-13 DIAGNOSIS — R1012 Left upper quadrant pain: Secondary | ICD-10-CM | POA: Insufficient documentation

## 2017-04-13 LAB — COMPREHENSIVE METABOLIC PANEL
ALT: 21 U/L (ref 14–54)
AST: 25 U/L (ref 15–41)
Albumin: 4 g/dL (ref 3.5–5.0)
Alkaline Phosphatase: 116 U/L (ref 38–126)
Anion gap: 10 (ref 5–15)
BILIRUBIN TOTAL: 0.5 mg/dL (ref 0.3–1.2)
BUN: 16 mg/dL (ref 6–20)
CO2: 25 mmol/L (ref 22–32)
CREATININE: 0.81 mg/dL (ref 0.44–1.00)
Calcium: 9.4 mg/dL (ref 8.9–10.3)
Chloride: 103 mmol/L (ref 101–111)
GFR calc Af Amer: >60 mL/min (ref >60)
Glucose, Bld: 117 mg/dL — ABNORMAL HIGH (ref 65–99)
Potassium: 4 mmol/L (ref 3.5–5.1)
Sodium: 138 mmol/L (ref 135–145)
TOTAL PROTEIN: 8.3 g/dL — AB (ref 6.5–8.1)

## 2017-04-13 LAB — URINALYSIS, ROUTINE W REFLEX MICROSCOPIC
Bilirubin Urine: NEGATIVE
GLUCOSE, UA: NEGATIVE mg/dL
Ketones, ur: NEGATIVE mg/dL
NITRITE: NEGATIVE
Protein, ur: NEGATIVE mg/dL
SPECIFIC GRAVITY, URINE: 1.024 (ref 1.005–1.030)
pH: 5 (ref 5.0–8.0)

## 2017-04-13 LAB — CBC
HCT: 37.7 % (ref 36.0–46.0)
Hemoglobin: 12.2 g/dL (ref 12.0–15.0)
MCH: 26.9 pg (ref 26.0–34.0)
MCHC: 32.4 g/dL (ref 30.0–36.0)
MCV: 83 fL (ref 78.0–100.0)
PLATELETS: 385 10*3/uL (ref 150–400)
RBC: 4.54 MIL/uL (ref 3.87–5.11)
RDW: 14.6 % (ref 11.5–15.5)
WBC: 9.8 10*3/uL (ref 4.0–10.5)

## 2017-04-13 LAB — LIPASE, BLOOD: Lipase: 41 U/L (ref 11–51)

## 2017-04-13 LAB — I-STAT BETA HCG BLOOD, ED (MC, WL, AP ONLY)

## 2017-04-13 NOTE — ED Triage Notes (Signed)
Pt presents to the ed with complaints of upper abdominal pain and back pain x 3 days. Reports back pain is worse with movement.

## 2017-04-14 MED ORDER — SUCRALFATE 1 G PO TABS: 1.0000 g | ORAL_TABLET | Freq: Three times a day (TID) | ORAL | 0 refills | Status: DC

## 2017-04-14 NOTE — ED Provider Notes (Signed)
[facility] Provider Note   CSN: [id number] Arrival date & time: 04/13/17  1708     History   Chief Complaint Chief Complaint  Patient presents with  . Abdominal Pain  . Back Pain    HPI Michelle Scott is a 42 y.o. female.  Patient presents to the ED with a chief complaint of abdominal pain.  She states that she has had left upper abdominal pain x 4 days.  She states that she has experienced this before in the past.  She states that she has had associated post prandial pain.  She denies fever, chills, nausea, or vomiting.  She has not taken anything for her symptoms.   The history is provided by the patient. The history is limited by a language barrier Advertising copywriter(Professional Arabic Interpreter used). A language interpreter was used.    Past Medical History:  Diagnosis Date  . Anemia   . Gestational diabetes   . UTI (urinary tract infection)     Patient Active Problem List   Diagnosis Date Noted  . Gestational diabetes 04/01/2016  . 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 Language barrier 10/15/2015  . MALARIA, HX OF 05/04/2009    Past Surgical History:  Procedure Laterality Date  . NO PAST SURGERIES      OB History    Gravida  8   Para  8   Term  8   Preterm      AB      Living  7     SAB  0   TAB  0   Ectopic  0   Multiple  0   Live Births  8            Home Medications    Prior to Admission medications   Medication Sig Start Date End Date Taking? Authorizing Provider  norethindrone (CAMILA) 0.35 MG tablet Take 1 tablet (0.35 mg total) by mouth daily. Patient not taking: Reported on 11/12/2016 07/11/16   Katrinka BlazingSmith, [place], CNM  ondansetron (ZOFRAN ODT) 4 MG disintegrating tablet Take 1 tablet (4 mg total) by mouth every 8 (eight) hours as needed for nausea or vomiting. 11/12/16   Dietrich PatesKhatri, [place], PA-C    Family History Family History  Problem Relation Age of Onset  . Hypertension Mother   . Diabetes Mother      Social History Social History   Tobacco Use  . Smoking status: Never Smoker  . Smokeless tobacco: Never Used  Substance Use Topics  . Alcohol use: No  . Drug use: No     Allergies   Patient has no known allergies.   Review of Systems Review of Systems  All other systems reviewed and are negative.    Physical Exam Updated Vital Signs BP 130/80   Pulse 85   Temp 98.8 F (37.1 C)   Resp 17   Wt 66.2 kg (146 lb)   SpO2 100%   BMI 25.06 kg/m   Physical Exam  Constitutional: She is oriented to person, place, and time. No distress.  HENT:  Head: Normocephalic and atraumatic.  Eyes: Pupils are equal, round, and reactive to light. Conjunctivae and EOM are normal.  Neck: No tracheal deviation present.  Cardiovascular: Normal rate.  Pulmonary/Chest: Effort normal. No respiratory distress.  Abdominal: Soft.  Left upper abdominal tenderness, no other focal abdominal tenderness  Musculoskeletal: Normal range of motion.  Neurological: She is alert and oriented to person, place, and time.  Skin: Skin is warm and  dry. She is not diaphoretic.  Psychiatric: Judgment normal.  Nursing note and vitals reviewed.  *Exam severely limited due to patient not allowing exam.  I offered to have female provider examine patient, but this was declined due to length of stay.  ED Treatments / Results  Labs (all labs ordered are listed, but only abnormal results are displayed) Labs Reviewed  COMPREHENSIVE METABOLIC PANEL - Abnormal; Notable for the following components:      Result Value   Glucose, Bld 117 (*)    Total Protein 8.3 (*)    All other components within normal limits  URINALYSIS, ROUTINE W REFLEX MICROSCOPIC - Abnormal; Notable for the following components:   Hgb urine dipstick SMALL (*)    Leukocytes, UA TRACE (*)    Bacteria, UA RARE (*)    Squamous Epithelial / LPF 0-5 (*)    All other components within normal limits  LIPASE, BLOOD  CBC  I-STAT BETA HCG BLOOD,  ED ([facility], [facility], [facility] ONLY)    EKG  EKG Interpretation None       Radiology No results found.  Procedures Procedures (including critical care time)  Medications Ordered in ED Medications - No data to display   Initial Impression / Assessment and Plan / ED Course  I have reviewed the triage vital signs and the nursing notes.  Pertinent labs & imaging results that were available during my care of the patient were reviewed by me and considered in my medical decision making (see chart for details).     Patient with left upper abdominal pain.  Associated post-prandial pain.  Could be GERD or PUD.  Will try carafate.  Exam significantly limited, as patient and husband would not let me touch patient except for abdominal exam and auscultation of heart and lungs.  I did offer to have the patient examined by a female provider, but this was declined due to patient and family frustration over their length of stay already.  They are also frustrated with being put in a hallway bed.  I apologized for this.  No other beds are immediately available.  Patient also reports heaviness in bilateral upper and lower extremities, but denies weakness, however, I am not allowed to perform a neurogical exam.  Again offered to see female and be relocated, but patient and family remain frustrated and are instead agreeable to follow-up with PCP.  No apparent emergent condition exists.  VSS.  Patient ambulates and moves and extremities.  Labs are normal.  Discussed HGB in urine and recommended PCP follow-up, no dysuria or flank pain.  Not pregnant.    Final Clinical Impressions(s) / ED Diagnoses   Final diagnoses:  Left upper quadrant pain    ED Discharge Orders        Ordered    sucralfate (CARAFATE) 1 g tablet  3 times daily with meals & bedtime     04/14/17 0121       Roxy Horseman, PA-C 04/14/17 0122    Dione Booze, MD 04/14/17 0630

## 2017-04-14 NOTE — ED Notes (Signed)
ED Provider at bedside. 

## 2017-04-14 NOTE — ED Notes (Signed)
Pt's husband returned for paperwork; upset about wait time for paperwork. Explained to husband delay. Unable to obtain pt's discharge vitals since pt did not return

## 2017-04-14 NOTE — ED Notes (Signed)
Unable to locate pt for discharge vitals or to provide discharge instructions

## 2017-08-01 ENCOUNTER — Encounter (HOSPITAL_COMMUNITY): Payer: Self-pay | Admitting: Emergency Medicine

## 2017-08-01 ENCOUNTER — Emergency Department (HOSPITAL_COMMUNITY)
Admission: EM | Admit: 2017-08-01 | Discharge: 2017-08-01 | Disposition: A | Discharge status: Home / Self Care | Payer: Self-pay | Attending: Emergency Medicine | Admitting: Emergency Medicine

## 2017-08-01 ENCOUNTER — Emergency Department (HOSPITAL_COMMUNITY): Payer: Self-pay

## 2017-08-01 DIAGNOSIS — R1110 Vomiting, unspecified: Secondary | ICD-10-CM | POA: Insufficient documentation

## 2017-08-01 DIAGNOSIS — N63 Unspecified lump in unspecified breast: Secondary | ICD-10-CM

## 2017-08-01 DIAGNOSIS — R042 Hemoptysis: Secondary | ICD-10-CM | POA: Insufficient documentation

## 2017-08-01 DIAGNOSIS — Z79899 Other long term (current) drug therapy: Secondary | ICD-10-CM | POA: Insufficient documentation

## 2017-08-01 DIAGNOSIS — R1012 Left upper quadrant pain: Secondary | ICD-10-CM | POA: Insufficient documentation

## 2017-08-01 LAB — URINALYSIS, ROUTINE W REFLEX MICROSCOPIC
Bilirubin Urine: NEGATIVE
GLUCOSE, UA: NEGATIVE mg/dL
Hgb urine dipstick: NEGATIVE
Ketones, ur: NEGATIVE mg/dL
Nitrite: NEGATIVE
PH: 5 (ref 5.0–8.0)
Protein, ur: NEGATIVE mg/dL
SPECIFIC GRAVITY, URINE: 1.031 — AB (ref 1.005–1.030)

## 2017-08-01 LAB — COMPREHENSIVE METABOLIC PANEL
ALK PHOS: 130 U/L — AB (ref 38–126)
ALT: 21 U/L (ref 0–44)
AST: 28 U/L (ref 15–41)
Albumin: 4 g/dL (ref 3.5–5.0)
Anion gap: 8 (ref 5–15)
BUN: 11 mg/dL (ref 6–20)
CALCIUM: 9.5 mg/dL (ref 8.9–10.3)
CO2: 30 mmol/L (ref 22–32)
CREATININE: 0.68 mg/dL (ref 0.44–1.00)
Chloride: 105 mmol/L (ref 98–111)
GFR calc non Af Amer: >60 mL/min (ref >60)
Glucose, Bld: 93 mg/dL (ref 70–99)
Potassium: 4.4 mmol/L (ref 3.5–5.1)
SODIUM: 143 mmol/L (ref 135–145)
Total Bilirubin: 0.5 mg/dL (ref 0.3–1.2)
Total Protein: 8.4 g/dL — ABNORMAL HIGH (ref 6.5–8.1)

## 2017-08-01 LAB — CBC
HCT: 37.1 % (ref 36.0–46.0)
Hemoglobin: 11.8 g/dL — ABNORMAL LOW (ref 12.0–15.0)
MCH: 26.3 pg (ref 26.0–34.0)
MCHC: 31.8 g/dL (ref 30.0–36.0)
MCV: 82.8 fL (ref 78.0–100.0)
PLATELETS: 383 10*3/uL (ref 150–400)
RBC: 4.48 MIL/uL (ref 3.87–5.11)
RDW: 15.4 % (ref 11.5–15.5)
WBC: 8.6 10*3/uL (ref 4.0–10.5)

## 2017-08-01 LAB — I-STAT BETA HCG BLOOD, ED (MC, WL, AP ONLY): I-stat hCG, quantitative: <5 m[IU]/mL (ref <5)

## 2017-08-01 LAB — LIPASE, BLOOD: Lipase: 33 U/L (ref 11–51)

## 2017-08-01 MED ORDER — FAMOTIDINE 20 MG PO TABS: 20.0000 mg | ORAL_TABLET | Freq: Two times a day (BID) | ORAL | 0 refills | Status: DC

## 2017-08-01 MED ORDER — SUCRALFATE 1 G PO TABS: 1.0000 g | ORAL_TABLET | Freq: Three times a day (TID) | ORAL | 0 refills | Status: DC

## 2017-08-01 MED ORDER — GI COCKTAIL ~~LOC~~
30.0000 mL | Freq: Once | ORAL | Status: AC
  Administered 2017-08-01: 30 mL via ORAL
  Filled 2017-08-01: qty 30

## 2017-08-01 MED ORDER — IOPAMIDOL (ISOVUE-300) INJECTION 61%
INTRAVENOUS | Status: AC
  Filled 2017-08-01: qty 100

## 2017-08-01 MED ORDER — SUCRALFATE 1 G PO TABS
1.0000 g | ORAL_TABLET | Freq: Once | ORAL | Status: AC
  Administered 2017-08-01: 1 g via ORAL
  Filled 2017-08-01: qty 1

## 2017-08-01 MED ORDER — FAMOTIDINE 20 MG PO TABS
40.0000 mg | ORAL_TABLET | Freq: Once | ORAL | Status: AC
  Administered 2017-08-01: 40 mg via ORAL
  Filled 2017-08-01: qty 2

## 2017-08-01 MED ORDER — IOPAMIDOL (ISOVUE-300) INJECTION 61%
100.0000 mL | Freq: Once | INTRAVENOUS | Status: AC | PRN
  Administered 2017-08-01: 100 mL via INTRAVENOUS

## 2017-08-01 NOTE — ED Triage Notes (Signed)
Patient c/o LUQ pain. Denies N/V/D. Reports she was seen at PCP today and sent for further evaluation of ulcer after "spitting up blood yesterday."

## 2017-08-01 NOTE — ED Notes (Signed)
Pt given specific instructions to follow up and obtain a mammogram. Pt and family expressed understanding.

## 2017-08-01 NOTE — ED Provider Notes (Signed)
[facility]-EMERGENCY DEPT Provider Note   CSN: [id number] Arrival date & time: 08/01/17  1558     History   Chief Complaint Chief Complaint  Patient presents with  . Abdominal Pain    HPI Michelle Scott is a 42 y.o. female.  42 year old female presents with abdominal pain x1 day.  Pain is persistent and localized to her left upper quadrant.  Does have a history of peptic ulcer disease and has not been compliant with her Carafate.  Patient had emesis x1 and did spit up some blood which was mixed with mucus.  Denies any overt hematemesis.  No black stools.  No fever or chills.  Went to an urgent care center prior to coming here and that note was reviewed.  There was concern for possible upper GI bleed and she was referred here for further management.     Past Medical History:  Diagnosis Date  . Anemia   . Gestational diabetes   . UTI (urinary tract infection)     Patient Active Problem List   Diagnosis Date Noted  . Gestational diabetes 04/01/2016  . 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 Language barrier 10/15/2015  . MALARIA, HX OF 05/04/2009    Past Surgical History:  Procedure Laterality Date  . NO PAST SURGERIES       OB History    Gravida  8   Para  8   Term  8   Preterm      AB      Living  7     SAB  0   TAB  0   Ectopic  0   Multiple  0   Live Births  8            Home Medications    Prior to Admission medications   Medication Sig Start Date End Date Taking? Authorizing Provider  norethindrone (CAMILA) 0.35 MG tablet Take 1 tablet (0.35 mg total) by mouth daily. Patient not taking: Reported on 11/12/2016 07/11/16   Katrinka BlazingSmith, [place], CNM  ondansetron (ZOFRAN ODT) 4 MG disintegrating tablet Take 1 tablet (4 mg total) by mouth every 8 (eight) hours as needed for nausea or vomiting. 11/12/16   Khatri, Hina, PA-C  sucralfate (CARAFATE) 1 g tablet Take 1 tablet (1 g total) by mouth 4 (four) times daily -  with meals  and at bedtime. 04/14/17   Roxy HorsemanBrowning, Robert, PA-C    Family History Family History  Problem Relation Age of Onset  . Hypertension Mother   . Diabetes Mother     Social History Social History   Tobacco Use  . Smoking status: Never Smoker  . Smokeless tobacco: Never Used  Substance Use Topics  . Alcohol use: No  . Drug use: No     Allergies   Patient has no known allergies.   Review of Systems Review of Systems  All other systems reviewed and are negative.    Physical Exam Updated Vital Signs BP 136/86 (BP Location: Left Arm)   Pulse 81   Temp 98 F (36.7 C) (Oral)   Resp 15   SpO2 100%   Physical Exam  Constitutional: She is oriented to person, place, and time. She appears well-developed and well-nourished.  Non-toxic appearance. No distress.  HENT:  Head: Normocephalic and atraumatic.  Eyes: Pupils are equal, round, and reactive to light. Conjunctivae, EOM and lids are normal.  Neck: Normal range of motion. Neck supple. No tracheal deviation present. No thyroid mass present.  Cardiovascular: Normal rate, regular rhythm and normal heart sounds. Exam reveals no gallop.  No murmur heard. Pulmonary/Chest: Effort normal and breath sounds normal. No stridor. No respiratory distress. She has no decreased breath sounds. She has no wheezes. She has no rhonchi. She has no rales.  Abdominal: Soft. Normal appearance and bowel sounds are normal. She exhibits no distension. There is tenderness in the left upper quadrant. There is no rebound and no CVA tenderness.    Musculoskeletal: Normal range of motion. She exhibits no edema or tenderness.  Neurological: She is alert and oriented to person, place, and time. She has normal strength. No cranial nerve deficit or sensory deficit. GCS eye subscore is 4. GCS verbal subscore is 5. GCS motor subscore is 6.  Skin: Skin is warm and dry. No abrasion and no rash noted.  Psychiatric: She has a normal mood and affect. Her speech is  normal and behavior is normal.  Nursing note and vitals reviewed.    ED Treatments / Results  Labs (all labs ordered are listed, but only abnormal results are displayed) Labs Reviewed  LIPASE, BLOOD  COMPREHENSIVE METABOLIC PANEL  CBC  URINALYSIS, ROUTINE W REFLEX MICROSCOPIC  I-STAT BETA HCG BLOOD, ED ([facility], [facility], [facility] ONLY)    EKG None  Radiology No results found.  Procedures Procedures (including critical care time)  Medications Ordered in ED Medications  gi cocktail (Maalox,Lidocaine,Donnatal) (has no administration in time range)  famotidine (PEPCID) tablet 40 mg (has no administration in time range)  sucralfate (CARAFATE) tablet 1 g (has no administration in time range)     Initial Impression / Assessment and Plan / ED Course  I have reviewed the triage vital signs and the nursing notes.  Pertinent labs & imaging results that were available during my care of the patient were reviewed by me and considered in my medical decision making (see chart for details).     Patient given medications for suspected peptic ulcer disease and feels better.  CT of abdomen results noted and patient has refused her breast exam.  I explained to the patient and the husband of the importance of follow-up to make sure that this is not cancer.  Will also place on new medications for peptic ulcer disease and give referral to GI on call  Final Clinical Impressions(s) / ED Diagnoses   Final diagnoses:  None    ED Discharge Orders    None       Lorre Nick, MD 08/01/17 2057

## 2017-08-01 NOTE — ED Notes (Signed)
Patient not giving consent for blood draw @ this time.

## 2017-08-01 NOTE — Discharge Instructions (Addendum)
You have a mass in your left breast that needs follow-up with a mammogram as soon as possible.  Contact the breast center to arrange for a mammogram.

## 2017-08-03 ENCOUNTER — Encounter: Payer: Self-pay | Admitting: Physician Assistant

## 2017-08-03 LAB — URINE CULTURE

## 2017-08-16 ENCOUNTER — Emergency Department (HOSPITAL_COMMUNITY)
Admission: EM | Admit: 2017-08-16 | Discharge: 2017-08-16 | Disposition: A | Discharge status: Home / Self Care | Payer: Self-pay | Attending: Emergency Medicine | Admitting: Emergency Medicine

## 2017-08-16 ENCOUNTER — Emergency Department (HOSPITAL_COMMUNITY): Payer: Self-pay

## 2017-08-16 ENCOUNTER — Other Ambulatory Visit: Payer: Self-pay

## 2017-08-16 ENCOUNTER — Encounter (HOSPITAL_COMMUNITY): Payer: Self-pay

## 2017-08-16 DIAGNOSIS — Z79899 Other long term (current) drug therapy: Secondary | ICD-10-CM | POA: Insufficient documentation

## 2017-08-16 DIAGNOSIS — R109 Unspecified abdominal pain: Secondary | ICD-10-CM

## 2017-08-16 DIAGNOSIS — R10814 Left lower quadrant abdominal tenderness: Secondary | ICD-10-CM | POA: Insufficient documentation

## 2017-08-16 DIAGNOSIS — R10816 Epigastric abdominal tenderness: Secondary | ICD-10-CM | POA: Insufficient documentation

## 2017-08-16 DIAGNOSIS — R0789 Other chest pain: Secondary | ICD-10-CM | POA: Insufficient documentation

## 2017-08-16 DIAGNOSIS — R1012 Left upper quadrant pain: Secondary | ICD-10-CM | POA: Insufficient documentation

## 2017-08-16 DIAGNOSIS — N90810 Female genital mutilation status, unspecified: Secondary | ICD-10-CM | POA: Insufficient documentation

## 2017-08-16 DIAGNOSIS — Z8613 Personal history of malaria: Secondary | ICD-10-CM | POA: Insufficient documentation

## 2017-08-16 LAB — URINALYSIS, ROUTINE W REFLEX MICROSCOPIC
BILIRUBIN URINE: NEGATIVE
Glucose, UA: NEGATIVE mg/dL
HGB URINE DIPSTICK: NEGATIVE
KETONES UR: NEGATIVE mg/dL
Leukocytes, UA: NEGATIVE
NITRITE: NEGATIVE
Protein, ur: NEGATIVE mg/dL
Specific Gravity, Urine: 1.006 (ref 1.005–1.030)
pH: 6 (ref 5.0–8.0)

## 2017-08-16 LAB — I-STAT TROPONIN, ED: Troponin i, poc: 0 ng/mL (ref 0.00–0.08)

## 2017-08-16 LAB — COMPREHENSIVE METABOLIC PANEL
ALT: 17 U/L (ref 0–44)
ANION GAP: 10 (ref 5–15)
AST: 35 U/L (ref 15–41)
Albumin: 3.9 g/dL (ref 3.5–5.0)
Alkaline Phosphatase: 131 U/L — ABNORMAL HIGH (ref 38–126)
BILIRUBIN TOTAL: 0.5 mg/dL (ref 0.3–1.2)
BUN: 12 mg/dL (ref 6–20)
CHLORIDE: 111 mmol/L (ref 98–111)
CO2: 29 mmol/L (ref 22–32)
Calcium: 9.7 mg/dL (ref 8.9–10.3)
Creatinine, Ser: 0.61 mg/dL (ref 0.44–1.00)
GFR calc Af Amer: >60 mL/min (ref >60)
GFR calc non Af Amer: >60 mL/min (ref >60)
Glucose, Bld: 103 mg/dL — ABNORMAL HIGH (ref 70–99)
POTASSIUM: 4.7 mmol/L (ref 3.5–5.1)
SODIUM: 150 mmol/L — AB (ref 135–145)
TOTAL PROTEIN: 8.4 g/dL — AB (ref 6.5–8.1)

## 2017-08-16 LAB — CBC WITH DIFFERENTIAL/PLATELET
BASOS ABS: 0 10*3/uL (ref 0.0–0.1)
Basophils Relative: 0 %
Eosinophils Absolute: 0 10*3/uL (ref 0.0–0.7)
Eosinophils Relative: 0 %
HEMATOCRIT: 37.5 % (ref 36.0–46.0)
Hemoglobin: 12.1 g/dL (ref 12.0–15.0)
LYMPHS ABS: 2.2 10*3/uL (ref 0.7–4.0)
LYMPHS PCT: 28 %
MCH: 26.3 pg (ref 26.0–34.0)
MCHC: 32.3 g/dL (ref 30.0–36.0)
MCV: 81.5 fL (ref 78.0–100.0)
MONO ABS: 0.4 10*3/uL (ref 0.1–1.0)
Monocytes Relative: 5 %
NEUTROS ABS: 5.3 10*3/uL (ref 1.7–7.7)
Neutrophils Relative %: 67 %
Platelets: 417 10*3/uL — ABNORMAL HIGH (ref 150–400)
RBC: 4.6 MIL/uL (ref 3.87–5.11)
RDW: 15.3 % (ref 11.5–15.5)
WBC: 8 10*3/uL (ref 4.0–10.5)

## 2017-08-16 LAB — I-STAT BETA HCG BLOOD, ED (MC, WL, AP ONLY): I-stat hCG, quantitative: <5 m[IU]/mL (ref <5)

## 2017-08-16 LAB — LIPASE, BLOOD: Lipase: 32 U/L (ref 11–51)

## 2017-08-16 MED ORDER — CYCLOBENZAPRINE HCL 10 MG PO TABS: 10.0000 mg | ORAL_TABLET | Freq: Every evening | ORAL | 0 refills | Status: DC | PRN

## 2017-08-16 MED ORDER — SODIUM CHLORIDE 0.9 % IV BOLUS
1000.0000 mL | Freq: Once | INTRAVENOUS | Status: AC
  Administered 2017-08-16: 1000 mL via INTRAVENOUS

## 2017-08-16 MED ORDER — GI COCKTAIL ~~LOC~~
30.0000 mL | Freq: Once | ORAL | Status: AC
  Administered 2017-08-16: 30 mL via ORAL
  Filled 2017-08-16: qty 30

## 2017-08-16 MED ORDER — SODIUM CHLORIDE 0.9 % IV SOLN
Freq: Once | INTRAVENOUS | Status: AC
  Administered 2017-08-16: 20:00:00 via INTRAVENOUS

## 2017-08-16 MED ORDER — FENTANYL CITRATE (PF) 100 MCG/2ML IJ SOLN
50.0000 ug | Freq: Once | INTRAMUSCULAR | Status: AC
  Administered 2017-08-16: 50 ug via INTRAVENOUS
  Filled 2017-08-16: qty 2

## 2017-08-16 NOTE — ED Provider Notes (Signed)
[place] DEPT Provider Note   CSN: [id number] Arrival date & time: 08/16/17  1551     History   Chief Complaint Chief Complaint  Patient presents with  . Abdominal Pain    HPI Michelle Scott is a 42 y.o. female who presents today for evaluation of left upper quadrant abdominal pain.  She was seen here 2 weeks ago for the same thing and reports that has not gotten any better.  History obtained through her husband who acts as Optometrist with patient permission and corroborates information.  Patient has diffuse, migratory abdominal and chest pain.  This continues to be worse in the left upper quadrant.  Patient denies any diarrhea, nausea, vomiting.  She denies abnormal vaginal bleeding drainage or discharge.  No fevers at home.    HPI  Past Medical History:  Diagnosis Date  . Anemia   . Gestational diabetes   . UTI (urinary tract infection)     Patient Active Problem List   Diagnosis Date Noted  . Gestational diabetes 04/01/2016  . 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 Language barrier 10/15/2015  . MALARIA, HX OF 05/04/2009    Past Surgical History:  Procedure Laterality Date  . NO PAST SURGERIES       OB History    Gravida  8   Para  8   Term  8   Preterm      AB      Living  7     SAB  0   TAB  0   Ectopic  0   Multiple  0   Live Births  8            Home Medications    Prior to Admission medications   Medication Sig Start Date End Date Taking? Authorizing Provider  famotidine (PEPCID) 20 MG tablet Take 1 tablet (20 mg total) by mouth 2 (two) times daily. 08/01/17  Yes Lacretia Leigh, MD  cyclobenzaprine (FLEXERIL) 10 MG tablet Take 1 tablet (10 mg total) by mouth at bedtime as needed for muscle spasms. 08/16/17   Lorin Glass, PA-C  norethindrone (CAMILA) 0.35 MG tablet Take 1 tablet (0.35 mg total) by mouth daily. Patient not taking: Reported on 11/12/2016 07/11/16   Tamala Julian, [place], CNM    ondansetron (ZOFRAN ODT) 4 MG disintegrating tablet Take 1 tablet (4 mg total) by mouth every 8 (eight) hours as needed for nausea or vomiting. Patient not taking: Reported on 08/01/2017 11/12/16   Delia Heady, PA-C  sucralfate (CARAFATE) 1 g tablet Take 1 tablet (1 g total) by mouth 4 (four) times daily -  with meals and at bedtime. Patient not taking: Reported on 08/16/2017 08/01/17   Lacretia Leigh, MD    Family History Family History  Problem Relation Age of Onset  . Hypertension Mother   . Diabetes Mother     Social History Social History   Tobacco Use  . Smoking status: Never Smoker  . Smokeless tobacco: Never Used  Substance Use Topics  . Alcohol use: No  . Drug use: No     Allergies   Patient has no known allergies.   Review of Systems Review of Systems  Constitutional: Negative for chills and fever.  HENT: Negative for ear pain and sore throat.   Eyes: Negative for pain and visual disturbance.  Respiratory: Positive for chest tightness. Negative for cough and shortness of breath.   Cardiovascular: Positive for chest pain. Negative for palpitations and leg swelling.  Gastrointestinal: Positive for abdominal pain. Negative for constipation, diarrhea, nausea and vomiting.  Genitourinary: Negative for dysuria and hematuria.  Musculoskeletal: Positive for back pain. Negative for arthralgias.  Skin: Negative for color change and rash.  Neurological: Negative for seizures, syncope and headaches.  All other systems reviewed and are negative.    Physical Exam Updated Vital Signs BP 116/78 (BP Location: Left Arm)   Pulse 70   Temp 98.2 F (36.8 C) (Oral)   Resp 16   Ht '5\' 8"'  (1.727 m)   Wt 68 kg (150 lb)   SpO2 100%   BMI 22.81 kg/m   Physical Exam  Constitutional: She appears well-developed and well-nourished. No distress.  HENT:  Head: Normocephalic and atraumatic.  Eyes: Conjunctivae are normal.  Neck: Neck supple.  Cardiovascular: Normal rate, regular  rhythm and normal heart sounds.  No murmur heard. Pulmonary/Chest: Effort normal and breath sounds normal. No respiratory distress.  Abdominal: Soft. Bowel sounds are normal. There is tenderness in the epigastric area, left upper quadrant and left lower quadrant. There is no rigidity, no rebound and no guarding.  Musculoskeletal: She exhibits no edema.  Neurological: She is alert.  Skin: Skin is warm and dry.  Psychiatric: She has a normal mood and affect.  Nursing note and vitals reviewed.    ED Treatments / Results  Labs (all labs ordered are listed, but only abnormal results are displayed) Labs Reviewed  COMPREHENSIVE METABOLIC PANEL - Abnormal; Notable for the following components:      Result Value   Sodium 150 (*)    Glucose, Bld 103 (*)    Total Protein 8.4 (*)    Alkaline Phosphatase 131 (*)    All other components within normal limits  CBC WITH DIFFERENTIAL/PLATELET - Abnormal; Notable for the following components:   Platelets 417 (*)    All other components within normal limits  URINALYSIS, ROUTINE W REFLEX MICROSCOPIC - Abnormal; Notable for the following components:   Color, Urine STRAW (*)    All other components within normal limits  LIPASE, BLOOD  I-STAT TROPONIN, ED  I-STAT BETA HCG BLOOD, ED ([facility], [facility], [facility] ONLY)    EKG EKG Interpretation  Date/Time:  Wednesday August 16 2017 18:12:30 EDT Ventricular Rate:  73 PR Interval:    QRS Duration: 92 QT Interval:  371 QTC Calculation: 409 R Axis:   58 Text Interpretation:  Sinus rhythm Short PR interval Minimal ST depression, inferior leads since last tracing no significant change Confirmed by Malvin Johns [phone]) on 08/16/2017 7:49:36 PM   Radiology Dg Chest 2 View  Result Date: 08/16/2017 CLINICAL DATA:  Chest pain EXAM: CHEST - 2 VIEW COMPARISON:  11/12/2016 FINDINGS: The heart size and mediastinal contours are within normal limits. Both lungs are clear. The visualized skeletal structures are unremarkable.  IMPRESSION: No active cardiopulmonary disease. Electronically Signed   By: Donavan Foil M.D.   On: 08/16/2017 18:16   US Abdomen Limited  Result Date: 08/16/2017 CLINICAL DATA:  RIGHT upper quadrant pain today. EXAM: ULTRASOUND ABDOMEN LIMITED RIGHT UPPER QUADRANT COMPARISON:  None. FINDINGS: Gallbladder: No gallstones or wall thickening visualized. No sonographic Murphy sign noted by sonographer. Common bile duct: Diameter: 3 mm Liver: No focal lesion identified. Liver is diffusely echogenic indicating fatty infiltration. Portal vein is patent on color Doppler imaging with normal direction of blood flow towards the liver. IMPRESSION: 1. No acute findings.  No gallstones.  No evidence of cholecystitis. 2. Fatty infiltration of the liver. Electronically Signed   By:  Franki Cabot M.D.   On: 08/16/2017 20:52    Procedures Procedures (including critical care time)  Medications Ordered in ED Medications  gi cocktail (Maalox,Lidocaine,Donnatal) (30 mLs Oral Given 08/16/17 1846)  0.9 %  sodium chloride infusion ( Intravenous Stopped 08/16/17 2217)  sodium chloride 0.9 % bolus 1,000 mL (0 mLs Intravenous Stopped 08/16/17 2217)  fentaNYL (SUBLIMAZE) injection 50 mcg (50 mcg Intravenous Given 08/16/17 2047)     Initial Impression / Assessment and Plan / ED Course  I have reviewed the triage vital signs and the nursing notes.  Pertinent labs & imaging results that were available during my care of the patient were reviewed by me and considered in my medical decision making (see chart for details).    Patient presents today for evaluation of abdominal pain.  This is been going on for multiple weeks.  When this first started she had a CT abdomen without acute cause for her pain found.  Labs were obtained and reviewed today, significant for elevated sodium at 150, alk phos is very mildly elevated at 131 with platelets of 417, I interpret this is consistent with mild with mild dehydration.  Urine with out  evidence of infection.    Patient's pain is migratory in nature, not localized to one specific area, chest, belly, or side.  This lowers my concern for an acute life-threatening cause of her symptoms.  Given that she has previously had a CT scan ordered while she was having these symptoms with the mild elevation in alk phos obtained a right upper quadrant ultrasound which did not show any cause for her pain.  Question musculoskeletal cause, functional abdominal pain, versus psychosomatic.  Patient is a GI appointment scheduled, the importance of keeping this was stressed to her.  Wanted to treat patient with Bentyl, however she is breast-feeding and this is contraindicated.  Will the patient Flexeril to see if that will help narrow down her pain by laminating possible areas of muscular spasm such as in her back.  Return precautions were discussed with patient, who states her understanding.  Patient states understanding.  Discharged home.    Final Clinical Impressions(s) / ED Diagnoses   Final diagnoses:  Abdominal pain, unspecified abdominal location    ED Discharge Orders        Ordered    cyclobenzaprine (FLEXERIL) 10 MG tablet  At bedtime PRN     08/16/17 2135       Lorin Glass, PA-C 08/16/17 2227    Malvin Johns, MD 08/16/17 2324

## 2017-08-16 NOTE — ED Notes (Signed)
Bed: WA13 Expected date:  Expected time:  Means of arrival:  Comments: EMS abd pain  

## 2017-08-16 NOTE — Discharge Instructions (Signed)

## 2017-08-16 NOTE — ED Triage Notes (Signed)
Arrived via [facility] wirh c/o LUQ abd pain. Seen here 2 wks ago for same and states it isn't any better. Has appt with GI in Sept.

## 2017-08-16 NOTE — ED Notes (Addendum)
Pt. Documented in error US Abdomen limited.

## 2017-08-23 ENCOUNTER — Ambulatory Visit: Payer: Self-pay | Admitting: Physician Assistant

## 2017-09-05 ENCOUNTER — Encounter: Payer: Self-pay | Admitting: Gastroenterology

## 2018-04-18 IMAGING — US US OB COMP LESS 14 WK
1 series · 15 of 28 positions shown · non-contrast
Comparison: None.

CLINICAL DATA: Abdominal pain for 10 days. Estimated gestational
age by LMP is 6 weeks 5 days. Quantitative beta HCG is pending.

EXAM:
OBSTETRIC <14 WK ULTRASOUND
TECHNIQUE: Transabdominal ultrasound was performed for evaluation of the
gestation as well as the maternal uterus and adnexal regions.

[Series 1: us ob comp less 14 wk · 37 acquisitions, 15 frames shown]
[im 1/37]
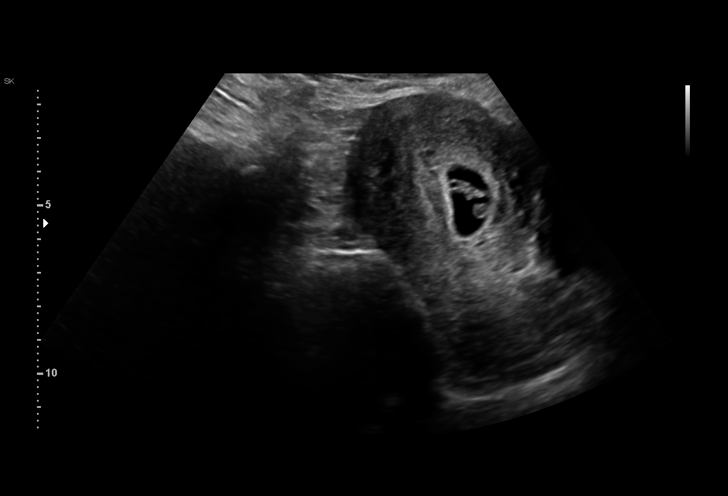
[im 3/37]
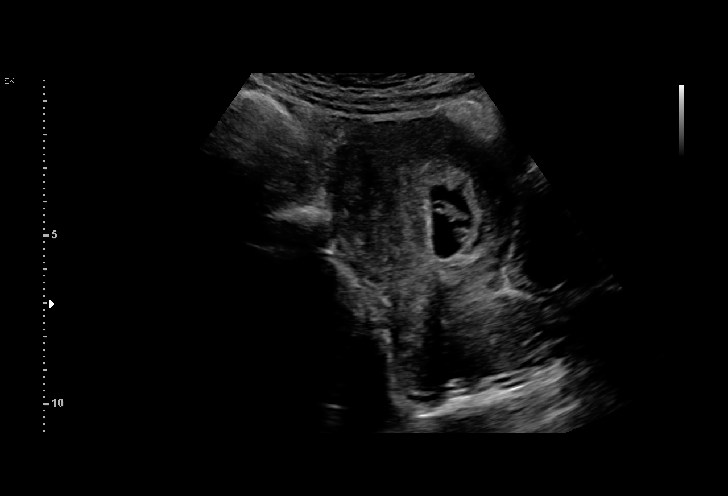
[im 6/37]
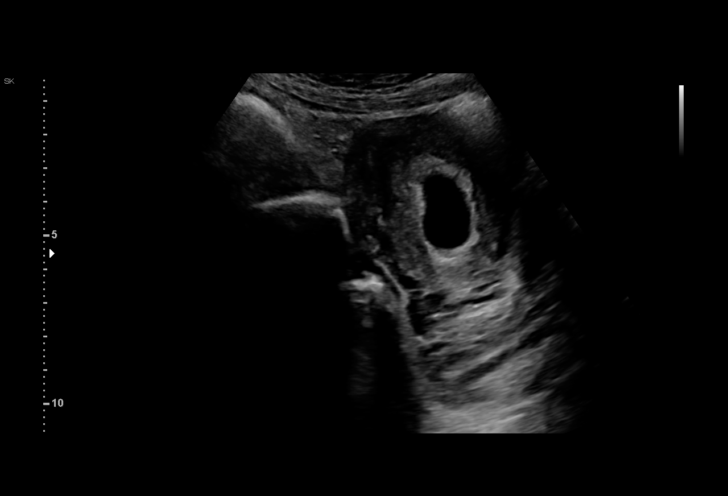
[im 9/37]
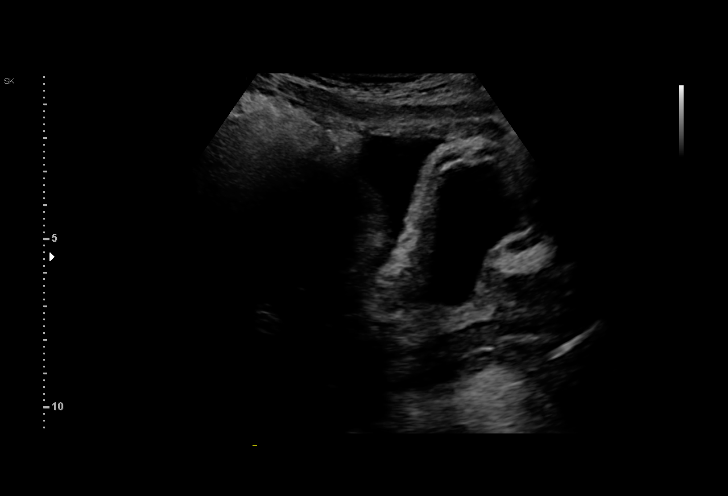
[im 11/37]
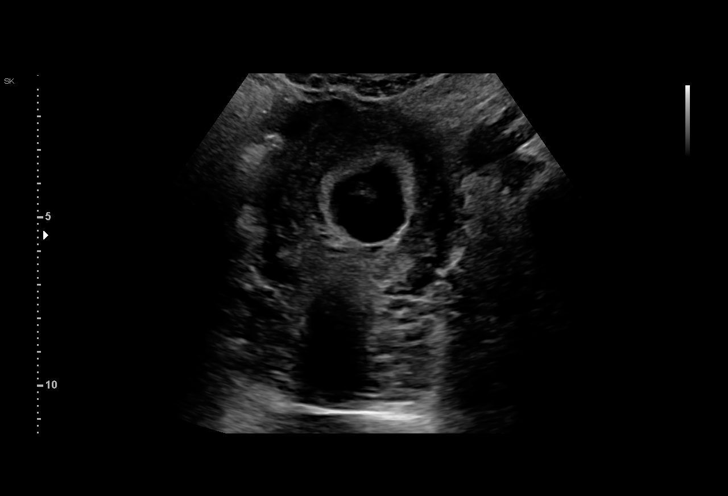
[im 14/37]
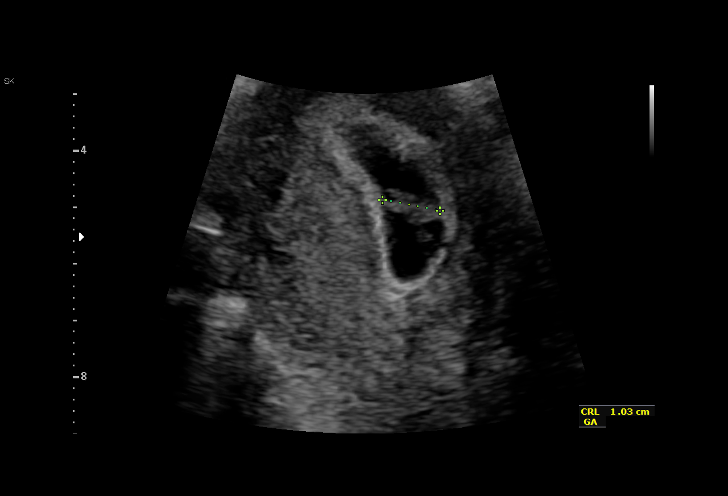
[im 17/37]
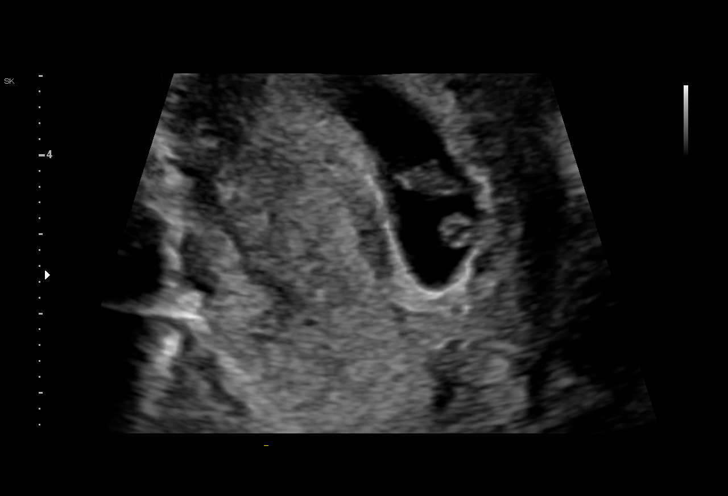
[im 19/37]
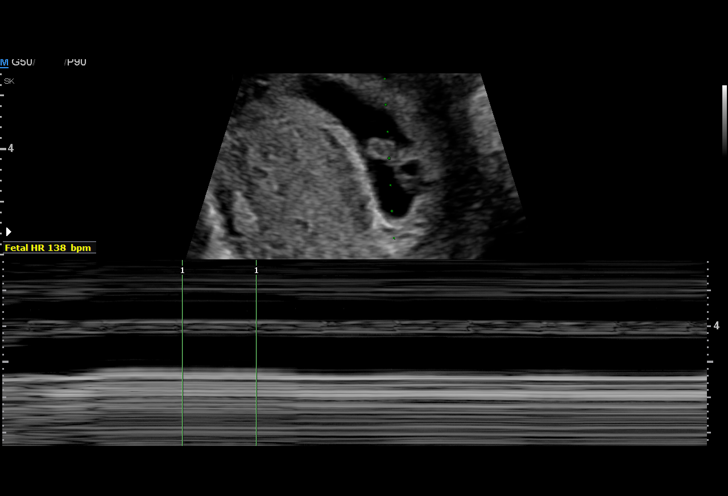
[im 21/37]
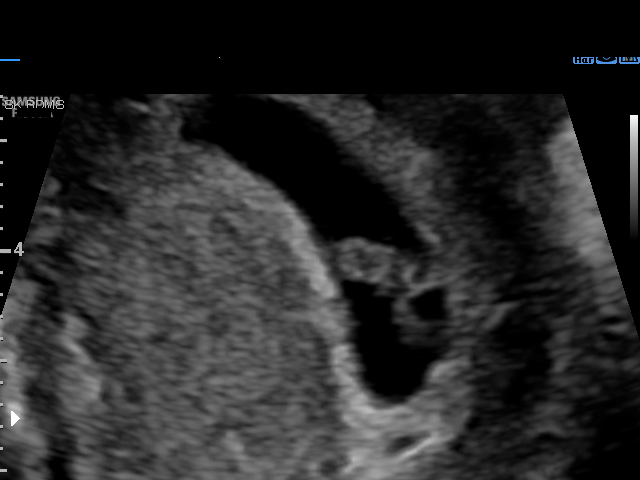
[im 23/37]
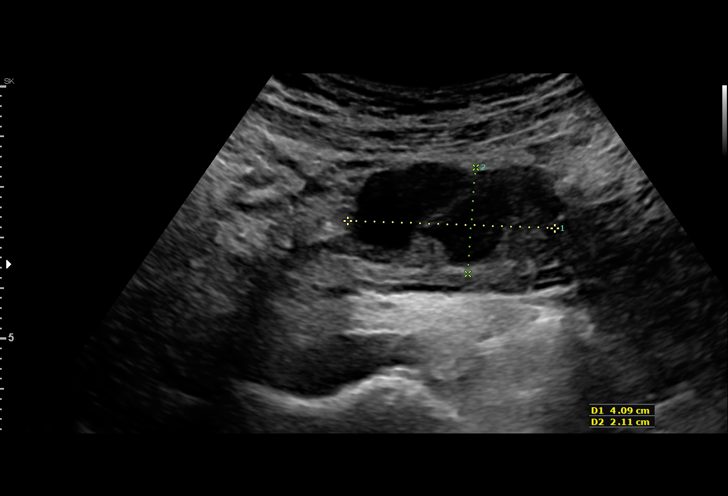
[im 26/37]
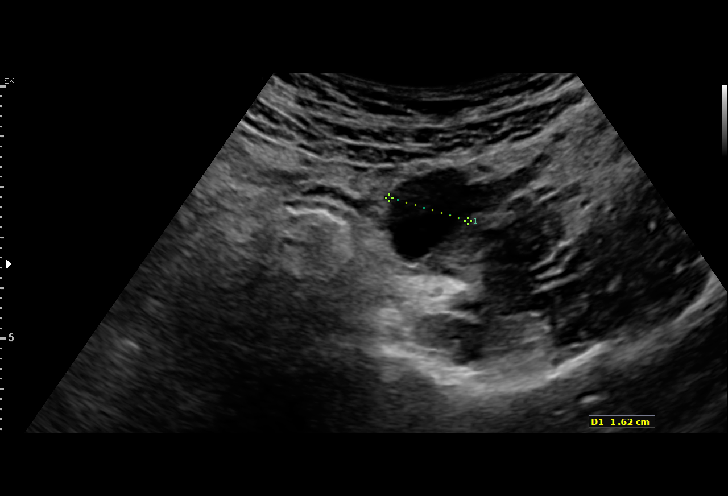
[im 29/37]
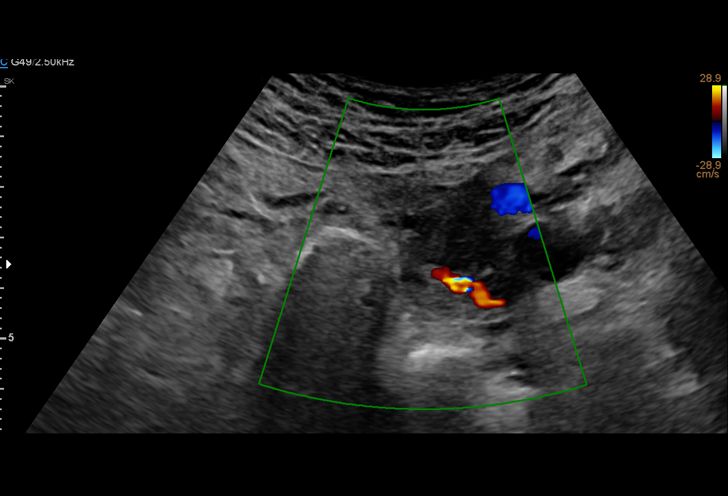
[im 31/37]
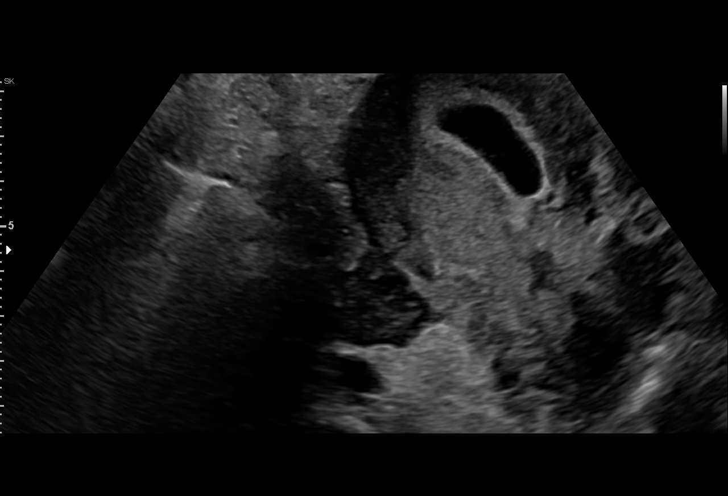
[im 34/37]
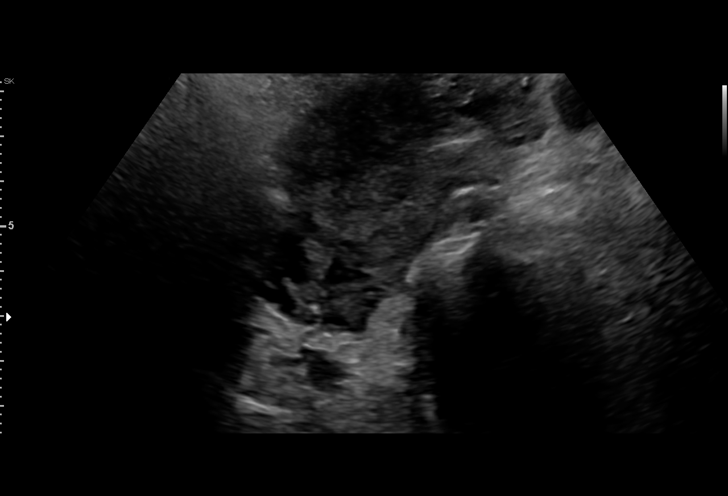
[im 37/37]
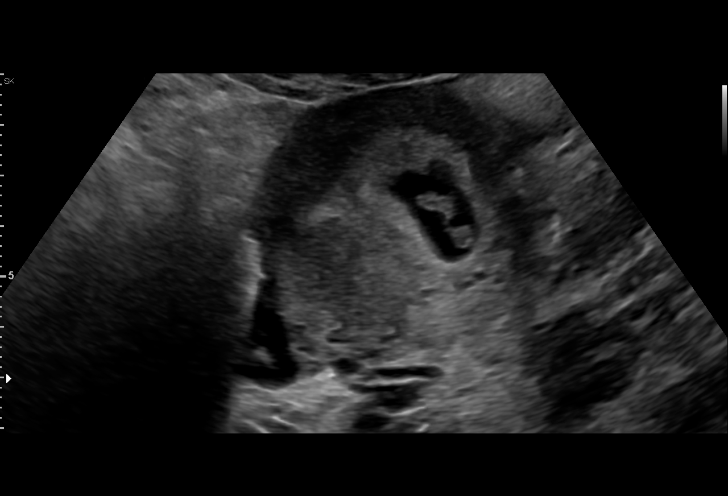

[15 of 28 positions shown; findings below may reference images not displayed]

FINDINGS: Intrauterine gestational sac: A single intrauterine gestational sac
is present.

Yolk sac:  Yolk sac is present.

Embryo:  Fetal pole is present.

Cardiac Activity: Fetal cardiac activity is observed.

Heart Rate: 138 bpm

CRL:   9.5  mm   6 w 6 d                  US EDC: 06/03/2016

Subchorionic hemorrhage:  None visualized.

Maternal uterus/adnexae: Uterus is anteverted. No myometrial mass
lesions demonstrated. Both ovaries are visualized and appear normal.
Benign-appearing cysts on the left ovary, largest measuring 2.4 cm
maximal diameter, likely functional. Small amount of free fluid in
the pelvis is likely physiologic.
IMPRESSION: Single intrauterine pregnancy. Estimated gestational age by
crown-rump length is 6 weeks 6 days. No acute complication
demonstrated sonographically. Small amount of free fluid in the
pelvis, likely physiologic.

## 2018-08-13 ENCOUNTER — Other Ambulatory Visit: Payer: Self-pay

## 2018-08-14 ENCOUNTER — Emergency Department (HOSPITAL_COMMUNITY)
Admission: EM | Admit: 2018-08-14 | Discharge: 2018-08-14 | Discharge status: Against Medical Advice | Payer: Medicaid Other

## 2018-08-14 ENCOUNTER — Encounter (HOSPITAL_COMMUNITY): Payer: Self-pay

## 2018-08-14 ENCOUNTER — Emergency Department (HOSPITAL_COMMUNITY): Payer: Medicaid Other

## 2018-08-14 ENCOUNTER — Emergency Department (HOSPITAL_COMMUNITY)
Admission: EM | Admit: 2018-08-14 | Discharge: 2018-08-14 | Disposition: A | Discharge status: Home / Self Care | Payer: Medicaid Other | Attending: Emergency Medicine | Admitting: Emergency Medicine

## 2018-08-14 DIAGNOSIS — R1013 Epigastric pain: Secondary | ICD-10-CM | POA: Insufficient documentation

## 2018-08-14 DIAGNOSIS — R10815 Periumbilic abdominal tenderness: Secondary | ICD-10-CM | POA: Diagnosis not present

## 2018-08-14 LAB — CBC WITH DIFFERENTIAL/PLATELET
Abs Immature Granulocytes: 0.01 10*3/uL (ref 0.00–0.07)
Basophils Absolute: 0 10*3/uL (ref 0.0–0.1)
Basophils Relative: 0 %
Eosinophils Absolute: 0.1 10*3/uL (ref 0.0–0.5)
Eosinophils Relative: 2 %
HCT: 36 % (ref 36.0–46.0)
Hemoglobin: 11 g/dL — ABNORMAL LOW (ref 12.0–15.0)
Immature Granulocytes: 0 %
Lymphocytes Relative: 37 %
Lymphs Abs: 3.3 10*3/uL (ref 0.7–4.0)
MCH: 25.8 pg — ABNORMAL LOW (ref 26.0–34.0)
MCHC: 30.6 g/dL (ref 30.0–36.0)
MCV: 84.3 fL (ref 80.0–100.0)
Monocytes Absolute: 0.6 10*3/uL (ref 0.1–1.0)
Monocytes Relative: 7 %
Neutro Abs: 4.9 10*3/uL (ref 1.7–7.7)
Neutrophils Relative %: 54 %
Platelets: 393 10*3/uL (ref 150–400)
RBC: 4.27 MIL/uL (ref 3.87–5.11)
RDW: 15 % (ref 11.5–15.5)
WBC: 8.9 10*3/uL (ref 4.0–10.5)
nRBC: 0 % (ref 0.0–0.2)

## 2018-08-14 LAB — URINALYSIS, ROUTINE W REFLEX MICROSCOPIC
Bilirubin Urine: NEGATIVE
Glucose, UA: NEGATIVE mg/dL
Hgb urine dipstick: NEGATIVE
Ketones, ur: NEGATIVE mg/dL
Nitrite: NEGATIVE
Protein, ur: NEGATIVE mg/dL
Specific Gravity, Urine: 1.02 (ref 1.005–1.030)
pH: 5 (ref 5.0–8.0)

## 2018-08-14 LAB — COMPREHENSIVE METABOLIC PANEL
ALT: 19 U/L (ref 0–44)
AST: 25 U/L (ref 15–41)
Albumin: 4.1 g/dL (ref 3.5–5.0)
Alkaline Phosphatase: 90 U/L (ref 38–126)
Anion gap: 12 (ref 5–15)
BUN: 9 mg/dL (ref 6–20)
CO2: 25 mmol/L (ref 22–32)
Calcium: 9.1 mg/dL (ref 8.9–10.3)
Chloride: 101 mmol/L (ref 98–111)
Creatinine, Ser: 0.72 mg/dL (ref 0.44–1.00)
GFR calc Af Amer: >60 mL/min (ref >60)
GFR calc non Af Amer: >60 mL/min (ref >60)
Glucose, Bld: 134 mg/dL — ABNORMAL HIGH (ref 70–99)
Potassium: 3.9 mmol/L (ref 3.5–5.1)
Sodium: 138 mmol/L (ref 135–145)
Total Bilirubin: 0.1 mg/dL — ABNORMAL LOW (ref 0.3–1.2)
Total Protein: 8.3 g/dL — ABNORMAL HIGH (ref 6.5–8.1)

## 2018-08-14 LAB — LIPASE, BLOOD: Lipase: 37 U/L (ref 11–51)

## 2018-08-14 LAB — POC URINE PREG, ED: Preg Test, Ur: NEGATIVE

## 2018-08-14 MED ORDER — IOHEXOL 300 MG/ML  SOLN
100.0000 mL | Freq: Once | INTRAMUSCULAR | Status: AC | PRN
  Administered 2018-08-14: 100 mL via INTRAVENOUS

## 2018-08-14 MED ORDER — SODIUM CHLORIDE (PF) 0.9 % IJ SOLN
INTRAMUSCULAR | Status: AC
  Filled 2018-08-14: qty 50

## 2018-08-14 NOTE — Discharge Instructions (Addendum)
Take tylenol or ibuprofen as needed for pain.  Follow up with a primary care doctor if the symptoms persist.

## 2018-08-14 NOTE — ED Notes (Signed)
Pt ambulated to BR with steady gait.

## 2018-08-14 NOTE — ED Notes (Signed)
Pt refused to use WALL-E for discharge instructions, and had husband on speaker phone for translations.

## 2018-08-14 NOTE — ED Notes (Signed)
Unable to locate the translator. Pt's husband was brought back to translate.

## 2018-08-14 NOTE — ED Notes (Signed)
Pt ambulated to room without assistance. Steady gate noted.
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
## 2019-01-02 DIAGNOSIS — Z139 Encounter for screening, unspecified: Secondary | ICD-10-CM

## 2019-01-02 LAB — GLUCOSE, POCT (MANUAL RESULT ENTRY): POC Glucose: 138 mg/dl — AB (ref 70–99)

## 2019-01-02 NOTE — [facility] Nurse Program (Signed)
  Dept: [facility] Nurse Program Note  Date of Encounter: 01/02/2019  Past Medical History: Past Medical History:  Diagnosis Date  . Anemia   . Gestational diabetes   . UTI (urinary tract infection)     Encounter Details: CNP Questionnaire - 01/02/19 1649      Questionnaire   Patient Status  Refugee    Race  African    Insurance  Medicaid    Uninsured  Not Applicable    Food  No food insecurities    Housing/Utilities  Yes, have permanent housing    Transportation  No transportation needs    Interpersonal Safety  Yes, feel physically and emotionally safe where you currently live    Medication  No medication insecurities    Medical Provider  No    Referrals  Not Applicable    ED Visit Averted  Not Applicable    Life-Saving Intervention Made  Not Applicable      Client came to clinic to get blood sugar checked. Her BS 138 not fasting. CN explained the test could be done again after not eating in the morning next week. She is not taking any regular medications. BP 105/73.  Marguerite Olea, RN, BSN, CNP [phone]

## 2019-01-22 ENCOUNTER — Emergency Department (HOSPITAL_COMMUNITY)
Admission: EM | Admit: 2019-01-22 | Discharge: 2019-01-22 | Disposition: A | Discharge status: Home / Self Care | Payer: Medicaid Other | Attending: Emergency Medicine | Admitting: Emergency Medicine

## 2019-01-22 ENCOUNTER — Other Ambulatory Visit: Payer: Self-pay

## 2019-01-22 ENCOUNTER — Encounter (HOSPITAL_COMMUNITY): Payer: Self-pay

## 2019-01-22 DIAGNOSIS — R1011 Right upper quadrant pain: Secondary | ICD-10-CM | POA: Diagnosis present

## 2019-01-22 LAB — COMPREHENSIVE METABOLIC PANEL
ALT: 14 U/L (ref 0–44)
AST: 18 U/L (ref 15–41)
Albumin: 3.6 g/dL (ref 3.5–5.0)
Alkaline Phosphatase: 77 U/L (ref 38–126)
Anion gap: 10 (ref 5–15)
BUN: 16 mg/dL (ref 6–20)
CO2: 25 mmol/L (ref 22–32)
Calcium: 9 mg/dL (ref 8.9–10.3)
Chloride: 102 mmol/L (ref 98–111)
Creatinine, Ser: 0.76 mg/dL (ref 0.44–1.00)
GFR calc Af Amer: >60 mL/min (ref >60)
GFR calc non Af Amer: >60 mL/min (ref >60)
Glucose, Bld: 142 mg/dL — ABNORMAL HIGH (ref 70–99)
Potassium: 3.9 mmol/L (ref 3.5–5.1)
Sodium: 137 mmol/L (ref 135–145)
Total Bilirubin: 0.3 mg/dL (ref 0.3–1.2)
Total Protein: 7.8 g/dL (ref 6.5–8.1)

## 2019-01-22 LAB — CBC WITH DIFFERENTIAL/PLATELET
Abs Immature Granulocytes: 0.03 10*3/uL (ref 0.00–0.07)
Basophils Absolute: 0 10*3/uL (ref 0.0–0.1)
Basophils Relative: 0 %
Eosinophils Absolute: 0.2 10*3/uL (ref 0.0–0.5)
Eosinophils Relative: 2 %
HCT: 33.4 % — ABNORMAL LOW (ref 36.0–46.0)
Hemoglobin: 10.3 g/dL — ABNORMAL LOW (ref 12.0–15.0)
Immature Granulocytes: 0 %
Lymphocytes Relative: 34 %
Lymphs Abs: 3 10*3/uL (ref 0.7–4.0)
MCH: 25.9 pg — ABNORMAL LOW (ref 26.0–34.0)
MCHC: 30.8 g/dL (ref 30.0–36.0)
MCV: 83.9 fL (ref 80.0–100.0)
Monocytes Absolute: 0.6 10*3/uL (ref 0.1–1.0)
Monocytes Relative: 7 %
Neutro Abs: 5 10*3/uL (ref 1.7–7.7)
Neutrophils Relative %: 57 %
Platelets: 406 10*3/uL — ABNORMAL HIGH (ref 150–400)
RBC: 3.98 MIL/uL (ref 3.87–5.11)
RDW: 16.2 % — ABNORMAL HIGH (ref 11.5–15.5)
WBC: 8.8 10*3/uL (ref 4.0–10.5)
nRBC: 0 % (ref 0.0–0.2)

## 2019-01-22 LAB — I-STAT BETA HCG BLOOD, ED (MC, WL, AP ONLY): I-stat hCG, quantitative: <5 m[IU]/mL (ref <5)

## 2019-01-22 LAB — LIPASE, BLOOD: Lipase: 34 U/L (ref 11–51)

## 2019-01-22 MED ORDER — ALUM & MAG HYDROXIDE-SIMETH 200-200-20 MG/5ML PO SUSP
30.0000 mL | Freq: Once | ORAL | Status: AC
  Administered 2019-01-22: 30 mL via ORAL
  Filled 2019-01-22: qty 30

## 2019-01-22 NOTE — ED Provider Notes (Signed)
[facility]   CSN: [id number] Arrival date & time: 01/22/19  0720     History Chief Complaint  Patient presents with  . Abdominal Pain    Michelle Scott is a 43 y.o. female.  43 yo F with a chief complaints of right-sided abdominal pain.  Is been going on for about 5 months now.  Worse with eating.  Nothing seems to make it better.  She was seen in the ED for this, initially occurred after a 8-year-old stepped on her abdomen.  She had that visit obtain a CT scan of the abdomen pelvis which was negative for acute pathology.  Patient has been taking ibuprofen but feels that her symptoms are worsening.  Has not seen a family physician for this.  She has tried boiling her food but that has not improved her symptoms.  She denies nausea vomiting or fever.  Denies cough or shortness of breath.  The history is provided by the patient.  Abdominal Pain Pain location:  RUQ Pain quality: aching and burning   Pain radiates to:  Does not radiate Pain severity:  Moderate Onset quality:  Gradual Duration:  20 weeks Timing:  Intermittent Progression:  Waxing and waning Chronicity:  New Relieved by:  Nothing Worsened by:  Eating Ineffective treatments:  None tried Associated symptoms: no chest pain, no chills, no constipation, no diarrhea, no dysuria, no fever, no nausea, no shortness of breath and no vomiting        Past Medical History:  Diagnosis Date  . Anemia   . Gestational diabetes   . UTI (urinary tract infection)     Patient Active Problem List   Diagnosis Date Noted  . Gestational diabetes 04/01/2016  . 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 Language barrier 10/15/2015  . MALARIA, HX OF 05/04/2009    Past Surgical History:  Procedure Laterality Date  . NO PAST SURGERIES       OB History    Gravida  8   Para  8   Term  8   Preterm      AB      Living  7     SAB  0   TAB  0   Ectopic  0   Multiple  0    Live Births  8           Family History  Problem Relation Age of Onset  . Hypertension Mother   . Diabetes Mother     Social History   Tobacco Use  . Smoking status: Never Smoker  . Smokeless tobacco: Never Used  Substance Use Topics  . Alcohol use: No  . Drug use: No    Home Medications Prior to Admission medications   Medication Sig Start Date End Date Taking? Authorizing Provider  acetaminophen (TYLENOL) 500 MG tablet Take 500 mg by mouth every 6 (six) hours as needed for mild pain.    [provider]  cyclobenzaprine (FLEXERIL) 10 MG tablet Take 1 tablet (10 mg total) by mouth at bedtime as needed for muscle spasms. Patient not taking: Reported on 08/14/2018 08/16/17   Cristina Gong, PA-C  famotidine (PEPCID) 20 MG tablet Take 1 tablet (20 mg total) by mouth 2 (two) times daily. Patient not taking: Reported on 08/14/2018 08/01/17   Lorre Nick, MD  norethindrone (CAMILA) 0.35 MG tablet Take 1 tablet (0.35 mg total) by mouth daily. Patient not taking: Reported on 11/12/2016 07/11/16   Dorathy Kinsman, CNM  ondansetron (ZOFRAN ODT) 4 MG disintegrating tablet Take 1 tablet (4 mg total) by mouth every 8 (eight) hours as needed for nausea or vomiting. Patient not taking: Reported on 08/01/2017 11/12/16   Delia Heady, PA-C  sucralfate (CARAFATE) 1 g tablet Take 1 tablet (1 g total) by mouth 4 (four) times daily -  with meals and at bedtime. Patient not taking: Reported on 08/16/2017 08/01/17   Lacretia Leigh, MD    Allergies    Gelatin  Review of Systems   Review of Systems  Constitutional: Negative for chills and fever.  HENT: Negative for congestion and rhinorrhea.   Eyes: Negative for redness and visual disturbance.  Respiratory: Negative for shortness of breath and wheezing.   Cardiovascular: Negative for chest pain and palpitations.  Gastrointestinal: Positive for abdominal pain. Negative for constipation, diarrhea, nausea and vomiting.  Genitourinary:  Negative for dysuria and urgency.  Musculoskeletal: Negative for arthralgias and myalgias.  Skin: Negative for pallor and wound.  Neurological: Negative for dizziness and headaches.    Physical Exam Updated Vital Signs BP (!) 150/91   Pulse 93   Temp 97.8 F (36.6 C) (Oral)   Resp 16   SpO2 100%   Physical Exam Vitals and nursing note reviewed.  Constitutional:      General: She is not in acute distress.    Appearance: She is well-developed. She is not diaphoretic.  HENT:     Head: Normocephalic and atraumatic.  Eyes:     Pupils: Pupils are equal, round, and reactive to light.  Cardiovascular:     Rate and Rhythm: Normal rate and regular rhythm.     Heart sounds: No murmur. No friction rub. No gallop.   Pulmonary:     Effort: Pulmonary effort is normal.     Breath sounds: No wheezing or rales.  Abdominal:     General: There is no distension.     Palpations: Abdomen is soft.     Tenderness: There is abdominal tenderness.     Comments: The patient has some tenderness in the right upper quadrant though it seems that the patient's pain is more localized to the right ribs.  No rash.  Musculoskeletal:        General: No tenderness.     Cervical back: Normal range of motion and neck supple.  Skin:    General: Skin is warm and dry.  Neurological:     Mental Status: She is alert and oriented to person, place, and time.  Psychiatric:        Behavior: Behavior normal.     ED Results / Procedures / Treatments   Labs (all labs ordered are listed, but only abnormal results are displayed) Labs Reviewed  CBC WITH DIFFERENTIAL/PLATELET - Abnormal; Notable for the following components:      Result Value   Hemoglobin 10.3 (*)    HCT 33.4 (*)    MCH 25.9 (*)    RDW 16.2 (*)    Platelets 406 (*)    All other components within normal limits  COMPREHENSIVE METABOLIC PANEL - Abnormal; Notable for the following components:   Glucose, Bld 142 (*)    All other components within  normal limits  LIPASE, BLOOD  I-STAT BETA HCG BLOOD, ED ([facility], [facility], [facility] ONLY)    EKG None  Radiology No results found.  Procedures Procedures (including critical care time)  Medications Ordered in ED Medications  alum & mag hydroxide-simeth (MAALOX/MYLANTA) 200-200-20 MG/5ML suspension 30 mL (30 mLs Oral Given 01/22/19  [id number])    ED Course  I have reviewed the triage vital signs and the nursing notes.  Pertinent labs & imaging results that were available during my care of the patient were reviewed by me and considered in my medical decision making (see chart for details).    MDM Rules/Calculators/A&P                      43 yo F with a chief complaints of right-sided pain.  This been going on for about 5 months now.  Worse with eating.  Pain seems more on the ribs that it does intra-abdominally.  That being said if the patient symptoms are worse with eating and not with movement or twisting as she states then I feel that reflux is the most likely diagnosis.  This is the patient's sixth visit to the ED for abdominal pain.  She has had a CT scan as well as a right upper quadrant ultrasound in the past.  I do not feel the repeat imaging is warranted.  Will obtain a laboratory evaluation for changes.  Give a GI cocktail reassess.  Labwork unremarkable.  Tolerating PO.   11:34 AM:  I have discussed the diagnosis/risks/treatment options with the patient and believe the pt to be eligible for discharge home to follow-up with PCP. We also discussed returning to the ED immediately if new or worsening sx occur. We discussed the sx which are most concerning (e.g., sudden worsening pain, fever, inability to tolerate by mouth) that necessitate immediate return. Medications administered to the patient during their visit and any new prescriptions provided to the patient are listed below.  Medications given during this visit Medications  alum & mag hydroxide-simeth (MAALOX/MYLANTA) 200-200-20 MG/5ML  suspension 30 mL (30 mLs Oral Given 01/22/19 0809)     The patient appears reasonably screen and/or stabilized for discharge and I doubt any other medical condition or other [facility] requiring further screening, evaluation, or treatment in the ED at this time prior to discharge.   Final Clinical Impression(s) / ED Diagnoses Final diagnoses:  Right upper quadrant abdominal pain    Rx / DC Orders ED Discharge Orders    None       Melene PlanFloyd, Denia Mcvicar, DO 01/22/19 1134

## 2019-01-22 NOTE — ED Triage Notes (Signed)
Pt c/o right sided abd pain. Denies N/V/D.

## 2019-09-12 ENCOUNTER — Encounter (HOSPITAL_COMMUNITY): Payer: Self-pay | Admitting: Emergency Medicine

## 2019-09-12 ENCOUNTER — Emergency Department (HOSPITAL_COMMUNITY): Payer: Medicaid Other

## 2019-09-12 ENCOUNTER — Other Ambulatory Visit: Payer: Self-pay

## 2019-09-12 ENCOUNTER — Emergency Department (HOSPITAL_COMMUNITY)
Admission: EM | Admit: 2019-09-12 | Discharge: 2019-09-12 | Disposition: A | Discharge status: Home / Self Care | Payer: Medicaid Other | Attending: Emergency Medicine | Admitting: Emergency Medicine

## 2019-09-12 DIAGNOSIS — N898 Other specified noninflammatory disorders of vagina: Secondary | ICD-10-CM | POA: Insufficient documentation

## 2019-09-12 DIAGNOSIS — R300 Dysuria: Secondary | ICD-10-CM | POA: Insufficient documentation

## 2019-09-12 DIAGNOSIS — R1030 Lower abdominal pain, unspecified: Secondary | ICD-10-CM | POA: Insufficient documentation

## 2019-09-12 DIAGNOSIS — N3000 Acute cystitis without hematuria: Secondary | ICD-10-CM | POA: Diagnosis not present

## 2019-09-12 DIAGNOSIS — R109 Unspecified abdominal pain: Secondary | ICD-10-CM | POA: Diagnosis present

## 2019-09-12 DIAGNOSIS — R1084 Generalized abdominal pain: Secondary | ICD-10-CM | POA: Insufficient documentation

## 2019-09-12 DIAGNOSIS — M5431 Sciatica, right side: Secondary | ICD-10-CM | POA: Insufficient documentation

## 2019-09-12 LAB — LIPASE, BLOOD: Lipase: 43 U/L (ref 11–51)

## 2019-09-12 LAB — CBC
HCT: 34.5 % — ABNORMAL LOW (ref 36.0–46.0)
Hemoglobin: 10.9 g/dL — ABNORMAL LOW (ref 12.0–15.0)
MCH: 26.1 pg (ref 26.0–34.0)
MCHC: 31.6 g/dL (ref 30.0–36.0)
MCV: 82.7 fL (ref 80.0–100.0)
Platelets: 385 10*3/uL (ref 150–400)
RBC: 4.17 MIL/uL (ref 3.87–5.11)
RDW: 15.7 % — ABNORMAL HIGH (ref 11.5–15.5)
WBC: 9.5 10*3/uL (ref 4.0–10.5)
nRBC: 0 % (ref 0.0–0.2)

## 2019-09-12 LAB — WET PREP, GENITAL
Sperm: NONE SEEN
Trich, Wet Prep: NONE SEEN
Yeast Wet Prep HPF POC: NONE SEEN

## 2019-09-12 LAB — COMPREHENSIVE METABOLIC PANEL
ALT: 15 U/L (ref 0–44)
AST: 19 U/L (ref 15–41)
Albumin: 4 g/dL (ref 3.5–5.0)
Alkaline Phosphatase: 62 U/L (ref 38–126)
Anion gap: 9 (ref 5–15)
BUN: 18 mg/dL (ref 6–20)
CO2: 21 mmol/L — ABNORMAL LOW (ref 22–32)
Calcium: 8.9 mg/dL (ref 8.9–10.3)
Chloride: 105 mmol/L (ref 98–111)
Creatinine, Ser: 0.66 mg/dL (ref 0.44–1.00)
GFR calc Af Amer: >60 mL/min (ref >60)
GFR calc non Af Amer: >60 mL/min (ref >60)
Glucose, Bld: 152 mg/dL — ABNORMAL HIGH (ref 70–99)
Potassium: 4 mmol/L (ref 3.5–5.1)
Sodium: 135 mmol/L (ref 135–145)
Total Bilirubin: 0.3 mg/dL (ref 0.3–1.2)
Total Protein: 8 g/dL (ref 6.5–8.1)

## 2019-09-12 LAB — URINALYSIS, ROUTINE W REFLEX MICROSCOPIC
Bilirubin Urine: NEGATIVE
Glucose, UA: NEGATIVE mg/dL
Hgb urine dipstick: NEGATIVE
Ketones, ur: NEGATIVE mg/dL
Nitrite: NEGATIVE
Protein, ur: NEGATIVE mg/dL
Specific Gravity, Urine: 1.015 (ref 1.005–1.030)
pH: 5 (ref 5.0–8.0)

## 2019-09-12 LAB — HCG, QUANTITATIVE, PREGNANCY: hCG, Beta Chain, Quant, S: <1 m[IU]/mL (ref <5)

## 2019-09-12 MED ORDER — KETOROLAC TROMETHAMINE 60 MG/2ML IM SOLN
60.0000 mg | Freq: Once | INTRAMUSCULAR | Status: AC
  Administered 2019-09-12: 60 mg via INTRAMUSCULAR
  Filled 2019-09-12: qty 2

## 2019-09-12 MED ORDER — METHOCARBAMOL 500 MG PO TABS: 500.0000 mg | ORAL_TABLET | Freq: Two times a day (BID) | ORAL | 0 refills | Status: DC

## 2019-09-12 MED ORDER — CEPHALEXIN 500 MG PO CAPS: 500.0000 mg | ORAL_CAPSULE | Freq: Three times a day (TID) | ORAL | 0 refills | Status: AC

## 2019-09-12 NOTE — ED Provider Notes (Signed)
[facility] Provider Note   CSN: [id number] Arrival date & time: 09/12/19  1583     History Chief Complaint  Patient presents with  . Abdominal Pain  . Flank Pain    Michelle Scott is a 44 y.o. female who presents for evaluation of lower abdominal pain and back pain that has been ongoing for the last 3 weeks.  She states initially, she started having some lower abdominal pain when she started her period on 08/21/19.  She states her period ended and she was still having pain.  She has not sought evaluation for this pain prior to today.  She states that the back pain has been there constantly and will occasionally radiate into her leg.  Sometimes she feels burning in her feet.  She has not had any numbness/weakness and is still been able to ambulate.  Patient reports that when the abdominal pain becomes worse, she takes ibuprofen and with no improvement in her symptoms.  She states that she has had some dysuria but has not noted any hematuria.  She is also noted she has had some yellow vaginal discharge has been ongoing for like the last month.  She is currently sexually active with 1 partner.  Patient states she has been able to eat and drink appropriately and denies any nausea/vomiting.  She has not any fever, chest pain, difficulty breathing. Denies fevers, weight loss, numbness/weakness of upper and lower extremities, history of back surgery, history of IVDA.    The history is provided by the patient. A language interpreter was used.       Past Medical History:  Diagnosis Date  . Anemia   . Gestational diabetes   . UTI (urinary tract infection)     Patient Active Problem List   Diagnosis Date Noted  . Gestational diabetes 04/01/2016  . 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 Language barrier 10/15/2015  . MALARIA, HX OF 05/04/2009    Past Surgical History:  Procedure Laterality Date  . NO PAST SURGERIES       OB History    Gravida  8    Para  8   Term  8   Preterm      AB      Living  7     SAB  0   TAB  0   Ectopic  0   Multiple  0   Live Births  8           Family History  Problem Relation Age of Onset  . Hypertension Mother   . Diabetes Mother     Social History   Tobacco Use  . Smoking status: Never Smoker  . Smokeless tobacco: Never Used  Substance Use Topics  . Alcohol use: No  . Drug use: No    Home Medications Prior to Admission medications   Medication Sig Start Date End Date Taking? Authorizing Provider  acetaminophen (TYLENOL) 500 MG tablet Take 500 mg by mouth every 6 (six) hours as needed for mild pain.    [provider]  cephALEXin (KEFLEX) 500 MG capsule Take 1 capsule (500 mg total) by mouth 3 (three) times daily for 7 days. 09/12/19 09/19/19  Maxwell Caul, PA-C  cyclobenzaprine (FLEXERIL) 10 MG tablet Take 1 tablet (10 mg total) by mouth at bedtime as needed for muscle spasms. Patient not taking: Reported on 08/14/2018 08/16/17   Cristina Gong, PA-C  famotidine (PEPCID) 20 MG tablet Take 1 tablet (20 mg  total) by mouth 2 (two) times daily. Patient not taking: Reported on 08/14/2018 08/01/17   Lorre Nick, MD  methocarbamol (ROBAXIN) 500 MG tablet Take 1 tablet (500 mg total) by mouth 2 (two) times daily. 09/12/19   Maxwell Caul, PA-C  norethindrone (CAMILA) 0.35 MG tablet Take 1 tablet (0.35 mg total) by mouth daily. Patient not taking: Reported on 11/12/2016 07/11/16   Katrinka Blazing, [place], CNM  ondansetron (ZOFRAN ODT) 4 MG disintegrating tablet Take 1 tablet (4 mg total) by mouth every 8 (eight) hours as needed for nausea or vomiting. Patient not taking: Reported on 08/01/2017 11/12/16   Dietrich Pates, PA-C  sucralfate (CARAFATE) 1 g tablet Take 1 tablet (1 g total) by mouth 4 (four) times daily -  with meals and at bedtime. Patient not taking: Reported on 08/16/2017 08/01/17   Lorre Nick, MD    Allergies    Gelatin  Review of Systems   Review of  Systems  Constitutional: Negative for fever.  Respiratory: Negative for cough and shortness of breath.   Cardiovascular: Negative for chest pain.  Gastrointestinal: Positive for abdominal pain. Negative for nausea and vomiting.  Genitourinary: Positive for dysuria and vaginal discharge. Negative for hematuria.  Neurological: Negative for headaches.  All other systems reviewed and are negative.   Physical Exam Updated Vital Signs BP 123/84   Pulse 77   Temp 97.7 F (36.5 C) (Oral)   Resp 17   Ht 5\' 7"  (1.702 m)   SpO2 100%   BMI 25.84 kg/m   Physical Exam Vitals and nursing note reviewed. Exam conducted with a chaperone present.  Constitutional:      Appearance: Normal appearance. She is well-developed.     Comments: Sitting comfortably on examination table  HENT:     Head: Normocephalic and atraumatic.  Eyes:     General: Lids are normal.     Conjunctiva/sclera: Conjunctivae normal.     Pupils: Pupils are equal, round, and reactive to light.     Comments: PERRL. EOMs intact. No nystagmus. No neglect.   Cardiovascular:     Rate and Rhythm: Normal rate and regular rhythm.     Pulses: Normal pulses.     Heart sounds: Normal heart sounds. No murmur heard.  No friction rub. No gallop.   Pulmonary:     Effort: Pulmonary effort is normal.     Breath sounds: Normal breath sounds.     Comments: Lungs clear to auscultation bilaterally.  Symmetric chest rise.  No wheezing, rales, rhonchi. Abdominal:     Palpations: Abdomen is soft. Abdomen is not rigid.     Tenderness: There is generalized abdominal tenderness and tenderness in the suprapubic area. There is no right CVA tenderness, left CVA tenderness or guarding.     Comments: Abdomen is soft, non-distended. No rigidity, guarding. Generalized abdominal tenderness noted with slight focal tenderness noted in the suprapubic region.  No CVA tenderness noted.  Genitourinary:    Comments: The exam was performed with a chaperone  present (RN ). Normal external female genitalia. No lesions, rash, or sores.  Small amount of yellow discharge noted.  No cervical erythema.  No CMT.  No adnexal mass or tenderness noted bilaterally. Musculoskeletal:        General: Normal range of motion.     Cervical back: Full passive range of motion without pain.     Comments: Mild midline lumbar spine tenderness.  No deformity or crepitus noted.  This tenderness extends to the gluteal region.  Skin:    General: Skin is warm and dry.     Capillary Refill: Capillary refill takes less than 2 seconds.  Neurological:     Mental Status: She is alert and oriented to person, place, and time.     Comments: Follows commands, Moves all extremities  5/5 strength to BUE and BLE  Sensation intact throughout all major nerve distributions Positive SLR on the RLE  Psychiatric:        Speech: Speech normal.     ED Results / Procedures / Treatments   Labs (all labs ordered are listed, but only abnormal results are displayed) Labs Reviewed  WET PREP, GENITAL - Abnormal; Notable for the following components:      Result Value   Clue Cells Wet Prep HPF POC STAT (*)    WBC, Wet Prep HPF POC MODERATE (*)    All other components within normal limits  COMPREHENSIVE METABOLIC PANEL - Abnormal; Notable for the following components:   CO2 21 (*)    Glucose, Bld 152 (*)    All other components within normal limits  CBC - Abnormal; Notable for the following components:   Hemoglobin 10.9 (*)    HCT 34.5 (*)    RDW 15.7 (*)    All other components within normal limits  URINALYSIS, ROUTINE W REFLEX MICROSCOPIC - Abnormal; Notable for the following components:   APPearance HAZY (*)    Leukocytes,Ua SMALL (*)    Bacteria, UA FEW (*)    All other components within normal limits  URINE CULTURE  LIPASE, BLOOD  HCG, QUANTITATIVE, PREGNANCY  GC/CHLAMYDIA PROBE AMP ([place]) NOT AT [facility]    EKG None  Radiology CT Renal Stone  Study  Result Date: 09/12/2019 CLINICAL DATA:  Lower back pain abdominal pain, worse on the right. EXAM: CT ABDOMEN AND PELVIS WITHOUT CONTRAST TECHNIQUE: Multidetector CT imaging of the abdomen and pelvis was performed following the standard protocol without IV contrast. COMPARISON:  08/14/2018 FINDINGS: Lower chest:  No contributory findings. Hepatobiliary: No focal liver abnormality.No evidence of biliary obstruction or stone. Pancreas: Unremarkable. Spleen: Unremarkable. Adrenals/Urinary Tract: Negative adrenals. No hydronephrosis or ureteral stone. Punctate bilateral lower pole calculi seen on coronal reformats, 2 on the right and solitary on the left. Unremarkable bladder. Stomach/Bowel:  No obstruction. No appendicitis. Vascular/Lymphatic: No acute vascular abnormality. No mass or adenopathy. Reproductive:No pathologic findings. Other: No ascites or pneumoperitoneum. Musculoskeletal: No acute abnormalities. IMPRESSION: 1. No acute finding.  No hydronephrosis or ureteral calculus. 2. Punctate bilateral renal calculi. Electronically Signed   By: Marnee SpringJonathon  Watts M.D.   On: 09/12/2019 07:42    Procedures Procedures (including critical care time)  Medications Ordered in ED Medications  ketorolac (TORADOL) injection 60 mg (60 mg Intramuscular Given 09/12/19 1517)    ED Course  I have reviewed the triage vital signs and the nursing notes.  Pertinent labs & imaging results that were available during my care of the patient were reviewed by me and considered in my medical decision making (see chart for details).    MDM Rules/Calculators/A&P                          44 year old female who presents for evaluation of abdominal pain, back pain x3 weeks.  Her reports symptoms initially began after her last menstrual cycle.  She reports that she has had pain down her right lower extremity and some burning in her feet.  No numbness/weakness.  No fevers, nausea/vomiting.  On initial arrival, she is  afebrile, toxic appearing.  Vital signs are stable.  On exam, she has some generalized abdominal tenderness, slightly worse in the suprapubic region.  No CVA tenderness.  She does have positive straight leg raise on exam and has tenderness in the lower lumbar region that extends into the muscular distribution of the gluteal region consistent with sciatica.  I suspect that is most likely contributing to her symptoms.  Also consider GU etiology versus kidney etiology versus infectious process.  She also reports she has been having vaginal discharge for about a month.  Question if there is any gonorrhea/chlamydia that is contributing to her symptoms.  He does report that she is currently sexually active with her husband.  She does not have concerns for STD.  Labs and CT scan ordered at triage.  I did discuss with patient using the interpreter regarding obtaining a pelvic exam with gonorrhea/chlamydia cultures here in the ED.  After discussion, patient was agreeable.  Pregnancy is negative.  UA does show small leukocytes, pyuria, bacteria.  There is some squamous epithelium so question contaminant.  Will send for culture.  She is symptomatic with burning sensation.  We will plan to treat.  CMP shows BUN and creatinine within normal limits.  Lipase unremarkable.  CBC shows no leukocytosis.  Hemoglobin is 10.9.  CT scan shows no acute finding.  No evidence of hydronephrosis ureteral calculus.  There is evidence of punctate bilateral renal calculi noted.  Using the interpreter, I discussed these results with patient.   Pelvic exam as documented above.  She did have small amount of yellow discharge noted.  No CMT.  No adnexal mass or tenderness noted bilaterally.  No indication for further ultrasound imaging.  Using the Arabic interpreter, I discussed results with patient.  I did discuss with her regarding the pelvic exam and we discussed gonorrhea/chlamydia results pending.  I did offer her treatment given that  she was having vaginal discharge.  We also discussed about waiting for the results come back if they are abnormal, she could seek treatment at that time.  Patient expressed understanding and wished to wait until the results came back.  Patient given antibiotic for UTI as well as Robaxin for sciatica relief. At this time, patient exhibits no emergent life-threatening condition that require further evaluation in ED or admission. Patient had ample opportunity for questions and discussion. All patient's questions were answered with full understanding. Strict return precautions discussed. Patient expresses understanding and agreement to plan.   Portions of this note were generated with Scientist, clinical (histocompatibility and immunogenetics). Dictation errors may occur despite best attempts at proofreading.   Final Clinical Impression(s) / ED Diagnoses Final diagnoses:  Sciatica of right side  Acute cystitis without hematuria  Generalized abdominal pain    Rx / DC Orders ED Discharge Orders         Ordered    methocarbamol (ROBAXIN) 500 MG tablet  2 times daily        09/12/19 1714    cephALEXin (KEFLEX) 500 MG capsule  3 times daily        09/12/19 1714           Rosana Hoes 09/12/19 1841    Lorre Nick, MD 09/13/19 1325

## 2019-09-12 NOTE — ED Triage Notes (Addendum)
Using Arabic interpreter Adil [id number]  Last month, patients period was late and when it was over she started experiencing pain in her lower back and abdomen since. No vaginal bleeding. Denies N/V. Pain in the lower back and lower abdomen is constant. Also reports intermittent dizziness, but is not dizzy right now.  A&Ox4. Also reports a burning sensation on the bottom of her bilateral feet.

## 2019-09-12 NOTE — Discharge Instructions (Signed)
You can take Tylenol or Ibuprofen as directed for pain. You can alternate Tylenol and Ibuprofen every 4 hours. If you take Tylenol at 1pm, then you can take Ibuprofen at 5pm. Then you can take Tylenol again at 9pm.   Take Robaxin as prescribed. This medication will make you drowsy so do not drive or drink alcohol when taking it.  Take antibiotics as directed. Please take all of your antibiotics until finished.  As we discussed, your CT scan looked reassuring.  You do have evidence of kidney stones that are in the actual kidneys themselves.  This is something just to keep an eye on.  You have gonorrhea/chlamydia results pending. The test results with take 2-3 days to return. If there is an abnormal result, you will be notified.  If you are notified, you will need to be treated.  If you do not hear anything, that means the results were negative. You can also log on MyChart to see the results.   Return to the Emergency Department for any fever, abdominal pain, difficulty breathing, nausea/vomiting or any other worsening or concerning symptoms.            .      4 .       1        5 .         9 .   Robaxin    .               .      .         .        .        .     .      /  ??.    2-3  .         .       .           .      MyChart  .                    /        .

## 2019-09-13 LAB — GC/CHLAMYDIA PROBE AMP (~~LOC~~) NOT AT ARMC
Chlamydia: NEGATIVE
Comment: NEGATIVE
Comment: NORMAL
Neisseria Gonorrhea: NEGATIVE

## 2019-09-14 LAB — URINE CULTURE

## 2021-02-18 ENCOUNTER — Emergency Department (HOSPITAL_COMMUNITY)
Admission: EM | Admit: 2021-02-18 | Discharge: 2021-02-19 | Disposition: A | Discharge status: Home / Self Care | Payer: Medicaid Other | Attending: Emergency Medicine | Admitting: Emergency Medicine

## 2021-02-18 ENCOUNTER — Encounter (HOSPITAL_COMMUNITY): Payer: Self-pay

## 2021-02-18 ENCOUNTER — Other Ambulatory Visit: Payer: Self-pay

## 2021-02-18 DIAGNOSIS — R109 Unspecified abdominal pain: Secondary | ICD-10-CM

## 2021-02-18 DIAGNOSIS — Z79899 Other long term (current) drug therapy: Secondary | ICD-10-CM | POA: Insufficient documentation

## 2021-02-18 DIAGNOSIS — Z5321 Procedure and treatment not carried out due to patient leaving prior to being seen by health care provider: Secondary | ICD-10-CM | POA: Insufficient documentation

## 2021-02-18 DIAGNOSIS — R1032 Left lower quadrant pain: Secondary | ICD-10-CM | POA: Diagnosis not present

## 2021-02-18 LAB — COMPREHENSIVE METABOLIC PANEL
ALT: 15 U/L (ref 0–44)
AST: 20 U/L (ref 15–41)
Albumin: 4.2 g/dL (ref 3.5–5.0)
Alkaline Phosphatase: 77 U/L (ref 38–126)
Anion gap: 8 (ref 5–15)
BUN: 10 mg/dL (ref 6–20)
CO2: 26 mmol/L (ref 22–32)
Calcium: 9.5 mg/dL (ref 8.9–10.3)
Chloride: 101 mmol/L (ref 98–111)
Creatinine, Ser: 0.69 mg/dL (ref 0.44–1.00)
GFR, Estimated: >60 mL/min (ref >60)
Glucose, Bld: 120 mg/dL — ABNORMAL HIGH (ref 70–99)
Potassium: 3.7 mmol/L (ref 3.5–5.1)
Sodium: 135 mmol/L (ref 135–145)
Total Bilirubin: 0.5 mg/dL (ref 0.3–1.2)
Total Protein: 8.4 g/dL — ABNORMAL HIGH (ref 6.5–8.1)

## 2021-02-18 LAB — CBC WITH DIFFERENTIAL/PLATELET
Abs Immature Granulocytes: 0.02 10*3/uL (ref 0.00–0.07)
Basophils Absolute: 0.1 10*3/uL (ref 0.0–0.1)
Basophils Relative: 1 %
Eosinophils Absolute: 0.1 10*3/uL (ref 0.0–0.5)
Eosinophils Relative: 1 %
HCT: 37.2 % (ref 36.0–46.0)
Hemoglobin: 11.8 g/dL — ABNORMAL LOW (ref 12.0–15.0)
Immature Granulocytes: 0 %
Lymphocytes Relative: 45 %
Lymphs Abs: 4.2 10*3/uL — ABNORMAL HIGH (ref 0.7–4.0)
MCH: 26.6 pg (ref 26.0–34.0)
MCHC: 31.7 g/dL (ref 30.0–36.0)
MCV: 83.8 fL (ref 80.0–100.0)
Monocytes Absolute: 0.6 10*3/uL (ref 0.1–1.0)
Monocytes Relative: 7 %
Neutro Abs: 4.4 10*3/uL (ref 1.7–7.7)
Neutrophils Relative %: 46 %
Platelets: 420 10*3/uL — ABNORMAL HIGH (ref 150–400)
RBC: 4.44 MIL/uL (ref 3.87–5.11)
RDW: 15.3 % (ref 11.5–15.5)
WBC: 9.5 10*3/uL (ref 4.0–10.5)
nRBC: 0 % (ref 0.0–0.2)

## 2021-02-18 LAB — URINALYSIS, ROUTINE W REFLEX MICROSCOPIC
Bacteria, UA: NONE SEEN
Bilirubin Urine: NEGATIVE
Glucose, UA: NEGATIVE mg/dL
Ketones, ur: NEGATIVE mg/dL
Nitrite: NEGATIVE
Protein, ur: NEGATIVE mg/dL
Specific Gravity, Urine: 1.011 (ref 1.005–1.030)
pH: 5 (ref 5.0–8.0)

## 2021-02-18 LAB — LIPASE, BLOOD: Lipase: 38 U/L (ref 11–51)

## 2021-02-18 NOTE — ED Triage Notes (Signed)
Pt complains of left lower abdominal pain since yesterday.

## 2021-02-19 ENCOUNTER — Emergency Department (HOSPITAL_COMMUNITY): Payer: Medicaid Other

## 2021-02-19 LAB — HCG, QUANTITATIVE, PREGNANCY: hCG, Beta Chain, Quant, S: 1 m[IU]/mL (ref <5)

## 2021-02-19 LAB — PREGNANCY, URINE: Preg Test, Ur: NEGATIVE

## 2021-02-19 MED ORDER — KETOROLAC TROMETHAMINE 30 MG/ML IJ SOLN
30.0000 mg | Freq: Once | INTRAMUSCULAR | Status: AC
  Administered 2021-02-19: 30 mg via INTRAVENOUS
  Filled 2021-02-19: qty 1

## 2021-02-19 NOTE — ED Notes (Signed)
Pt and husband state that they are leaving.

## 2021-02-19 NOTE — ED Provider Notes (Signed)
[facility]-EMERGENCY DEPT Provider Note   CSN: [id number] Arrival date & time: 02/18/21  1914     History  Chief Complaint  Patient presents with   Abdominal Pain    Michelle Scott is a 46 y.o. female.  Patient is a 46 year old female with past medical history of gestational diabetes.  Patient presenting today with complaints of left-sided flank and abdominal pain.  This started yesterday and is worsening.  The pain is constant with no associated bowel or bladder complaints.  She denies any fevers or chills.  She denies any aggravating or alleviating factors.  Patient speaks mainly Arabic and little [place].  Patient was taken with the assistance of the husband who is present at bedside.  He describes a similar episode several years ago, the cause of which he is uncertain.  The history is provided by the patient and the spouse.  Abdominal Pain Pain location:  LUQ, LLQ and L flank Pain quality: stabbing   Pain radiates to:  Does not radiate Pain severity:  Moderate Onset quality:  Sudden Duration:  2 days Timing:  Constant Progression:  Worsening Chronicity:  New Relieved by:  Nothing Worsened by:  Nothing     Home Medications Prior to Admission medications   Medication Sig Start Date End Date Taking? Authorizing Provider  acetaminophen (TYLENOL) 500 MG tablet Take 500 mg by mouth every 6 (six) hours as needed for mild pain.    [provider]  cyclobenzaprine (FLEXERIL) 10 MG tablet Take 1 tablet (10 mg total) by mouth at bedtime as needed for muscle spasms. Patient not taking: Reported on 08/14/2018 08/16/17   Cristina Gong, PA-C  famotidine (PEPCID) 20 MG tablet Take 1 tablet (20 mg total) by mouth 2 (two) times daily. Patient not taking: Reported on 08/14/2018 08/01/17   Lorre Nick, MD  methocarbamol (ROBAXIN) 500 MG tablet Take 1 tablet (500 mg total) by mouth 2 (two) times daily. 09/12/19   Maxwell Caul, PA-C  norethindrone  (CAMILA) 0.35 MG tablet Take 1 tablet (0.35 mg total) by mouth daily. Patient not taking: Reported on 11/12/2016 07/11/16   Katrinka Blazing, [place], CNM  ondansetron (ZOFRAN ODT) 4 MG disintegrating tablet Take 1 tablet (4 mg total) by mouth every 8 (eight) hours as needed for nausea or vomiting. Patient not taking: Reported on 08/01/2017 11/12/16   Dietrich Pates, PA-C  sucralfate (CARAFATE) 1 g tablet Take 1 tablet (1 g total) by mouth 4 (four) times daily -  with meals and at bedtime. Patient not taking: Reported on 08/16/2017 08/01/17   Lorre Nick, MD      Allergies    Gelatin    Review of Systems   Review of Systems  Gastrointestinal:  Positive for abdominal pain.  All other systems reviewed and are negative.  Physical Exam Updated Vital Signs BP (!) 137/91 (BP Location: Left Arm)    Pulse 91    Temp 97.7 F (36.5 C) (Oral)    Resp 16    Ht 5\' 7"  (1.702 m)    Wt 74.8 kg    SpO2 100%    BMI 25.84 kg/m  Physical Exam Vitals and nursing note reviewed.  Constitutional:      General: She is not in acute distress.    Appearance: She is well-developed. She is not diaphoretic.  HENT:     Head: Normocephalic and atraumatic.  Cardiovascular:     Rate and Rhythm: Normal rate and regular rhythm.     Heart sounds:  No murmur heard.   No friction rub. No gallop.  Pulmonary:     Effort: Pulmonary effort is normal. No respiratory distress.     Breath sounds: Normal breath sounds. No wheezing.  Abdominal:     General: Bowel sounds are normal. There is no distension.     Palpations: Abdomen is soft.     Tenderness: There is abdominal tenderness in the left upper quadrant and left lower quadrant. There is left CVA tenderness. There is no guarding or rebound.  Musculoskeletal:        General: Normal range of motion.     Cervical back: Normal range of motion and neck supple.  Skin:    General: Skin is warm and dry.  Neurological:     General: No focal deficit present.     Mental Status: She is alert  and oriented to person, place, and time.    ED Results / Procedures / Treatments   Labs (all labs ordered are listed, but only abnormal results are displayed) Labs Reviewed  COMPREHENSIVE METABOLIC PANEL - Abnormal; Notable for the following components:      Result Value   Glucose, Bld 120 (*)    Total Protein 8.4 (*)    All other components within normal limits  CBC WITH DIFFERENTIAL/PLATELET - Abnormal; Notable for the following components:   Hemoglobin 11.8 (*)    Platelets 420 (*)    Lymphs Abs 4.2 (*)    All other components within normal limits  URINALYSIS, ROUTINE W REFLEX MICROSCOPIC - Abnormal; Notable for the following components:   APPearance HAZY (*)    Hgb urine dipstick MODERATE (*)    Leukocytes,Ua SMALL (*)    All other components within normal limits  LIPASE, BLOOD  I-STAT BETA HCG BLOOD, ED ([facility], [facility], [facility] ONLY)    EKG None  Radiology No results found.  Procedures Procedures    Medications Ordered in ED Medications  ketorolac (TORADOL) 30 MG/ML injection 30 mg (has no administration in time range)    ED Course/ Medical Decision Making/ A&P  This patient presents to the ED for concern of left-sided abdominal and flank pain, this involves an extensive number of treatment options, and is a complaint that carries with it a high risk of complications and morbidity.  The differential diagnosis includes renal calculus, musculoskeletal, diverticulitis, ovarian cyst   Co morbidities that complicate the patient evaluation  None   Additional history obtained:  Additional history obtained from husband who was present at bedside No external records needed for review   Lab Tests:  I Ordered, and personally interpreted labs.  The pertinent results include: Unremarkable CBC, basic metabolic panel, and urinalysis   Imaging Studies ordered:  I ordered imaging studies including CT scan with renal protocol I independently visualized and interpreted imaging  which showed no evidence for obstructing kidney stone or other acute process I agree with the radiologist interpretation   Cardiac Monitoring:  No cardiac monitoring performed   Medicines ordered and prescription drug management:  I ordered medication including Toradol for pain Reevaluation of the patient after these medicines showed that the patient improved I have reviewed the patients home medicines and have made adjustments as needed   Test Considered:  No other test considered or ordered   Critical Interventions:  None   Consultations Obtained:  No consultations obtained or indicated   Problem List / ED Course:  Patient presenting with left flank pain since yesterday, the etiology of which I am uncertain.  There is no evidence for renal calculus or other acute abnormality on the CT scan.  Laboratory studies and urinalysis are unremarkable.  Shortly after the CT was performed, the patient and husband informed the nurse that they were leaving left prior to my reassessment and explanation of CT results.   Reevaluation:  Patient unable to be reevaluated secondary to them leaving prior to my reassessment   Social Determinants of Health:  None   Dispostion:  Patient eloped prior to my explanation of test results..    Final Clinical Impression(s) / ED Diagnoses Final diagnoses:  None    Rx / DC Orders ED Discharge Orders     None         Geoffery Lyons, MD 02/19/21 [phone]

## 2021-06-19 ENCOUNTER — Other Ambulatory Visit: Payer: Self-pay

## 2021-06-19 ENCOUNTER — Emergency Department (HOSPITAL_COMMUNITY)
Admission: EM | Admit: 2021-06-19 | Discharge: 2021-06-19 | Disposition: A | Discharge status: Home / Self Care | Payer: Medicaid Other | Attending: Emergency Medicine | Admitting: Emergency Medicine

## 2021-06-19 ENCOUNTER — Encounter (HOSPITAL_COMMUNITY): Payer: Self-pay | Admitting: *Deleted

## 2021-06-19 ENCOUNTER — Emergency Department (HOSPITAL_COMMUNITY): Payer: Medicaid Other

## 2021-06-19 DIAGNOSIS — R109 Unspecified abdominal pain: Secondary | ICD-10-CM | POA: Diagnosis present

## 2021-06-19 DIAGNOSIS — N9489 Other specified conditions associated with female genital organs and menstrual cycle: Secondary | ICD-10-CM | POA: Diagnosis not present

## 2021-06-19 DIAGNOSIS — R1032 Left lower quadrant pain: Secondary | ICD-10-CM | POA: Diagnosis not present

## 2021-06-19 DIAGNOSIS — R079 Chest pain, unspecified: Secondary | ICD-10-CM

## 2021-06-19 LAB — COMPREHENSIVE METABOLIC PANEL
ALT: 13 U/L (ref 0–44)
AST: 18 U/L (ref 15–41)
Albumin: 3.5 g/dL (ref 3.5–5.0)
Alkaline Phosphatase: 66 U/L (ref 38–126)
Anion gap: 5 (ref 5–15)
BUN: 10 mg/dL (ref 6–20)
CO2: 27 mmol/L (ref 22–32)
Calcium: 8.8 mg/dL — ABNORMAL LOW (ref 8.9–10.3)
Chloride: 105 mmol/L (ref 98–111)
Creatinine, Ser: 0.83 mg/dL (ref 0.44–1.00)
GFR, Estimated: >60 mL/min (ref >60)
Glucose, Bld: 153 mg/dL — ABNORMAL HIGH (ref 70–99)
Potassium: 3.9 mmol/L (ref 3.5–5.1)
Sodium: 137 mmol/L (ref 135–145)
Total Bilirubin: 0.4 mg/dL (ref 0.3–1.2)
Total Protein: 7.1 g/dL (ref 6.5–8.1)

## 2021-06-19 LAB — CBC
HCT: 33.2 % — ABNORMAL LOW (ref 36.0–46.0)
Hemoglobin: 10.5 g/dL — ABNORMAL LOW (ref 12.0–15.0)
MCH: 26.7 pg (ref 26.0–34.0)
MCHC: 31.6 g/dL (ref 30.0–36.0)
MCV: 84.5 fL (ref 80.0–100.0)
Platelets: 346 10*3/uL (ref 150–400)
RBC: 3.93 MIL/uL (ref 3.87–5.11)
RDW: 15 % (ref 11.5–15.5)
WBC: 7.7 10*3/uL (ref 4.0–10.5)
nRBC: 0 % (ref 0.0–0.2)

## 2021-06-19 LAB — URINALYSIS, ROUTINE W REFLEX MICROSCOPIC
Bilirubin Urine: NEGATIVE
Glucose, UA: NEGATIVE mg/dL
Hgb urine dipstick: NEGATIVE
Ketones, ur: NEGATIVE mg/dL
Leukocytes,Ua: NEGATIVE
Nitrite: NEGATIVE
Protein, ur: NEGATIVE mg/dL
Specific Gravity, Urine: 1.035 — ABNORMAL HIGH (ref 1.005–1.030)
pH: 7 (ref 5.0–8.0)

## 2021-06-19 LAB — I-STAT BETA HCG BLOOD, ED (MC, WL, AP ONLY): I-stat hCG, quantitative: <5 m[IU]/mL (ref <5)

## 2021-06-19 LAB — LIPASE, BLOOD: Lipase: 39 U/L (ref 11–51)

## 2021-06-19 LAB — TROPONIN I (HIGH SENSITIVITY)
Troponin I (High Sensitivity): <2 ng/L (ref <18)
Troponin I (High Sensitivity): <2 ng/L (ref <18)

## 2021-06-19 MED ORDER — IOHEXOL 300 MG/ML  SOLN
100.0000 mL | Freq: Once | INTRAMUSCULAR | Status: AC | PRN
  Administered 2021-06-19: 100 mL via INTRAVENOUS

## 2021-06-19 MED ORDER — SODIUM CHLORIDE (PF) 0.9 % IJ SOLN
INTRAMUSCULAR | Status: AC
  Filled 2021-06-19: qty 50

## 2021-06-19 NOTE — ED Triage Notes (Signed)
Pt has been having chest pain and abd pain on  and off for a few years. Pain episode started again this morning. No N/V/D. Chest pain in left side going up and into the left arm

## 2021-06-19 NOTE — ED Provider Notes (Signed)
[facility]-EMERGENCY DEPT Provider Note   CSN: [id number] Arrival date & time: 06/19/21  5465     History  Chief Complaint  Patient presents with   Chest Pain   Abdominal Pain    Michelle Scott is a 46 y.o. female.  Chief complaint of left-sided chest pain left-sided abdominal pain.  Symptoms ongoing for the past several years off-and-on.  This episode started this morning.  Describes as persistent.  Similar to prior episodes.  Nothing seems to bring it on.  No associated fevers or cough no vomiting or diarrhea reported.  She states the chest pain radiates down to her left arm.      Home Medications Prior to Admission medications   Medication Sig Start Date End Date Taking? Authorizing Provider  acetaminophen (TYLENOL) 500 MG tablet Take 500 mg by mouth every 6 (six) hours as needed for mild pain.    [provider]  cyclobenzaprine (FLEXERIL) 10 MG tablet Take 1 tablet (10 mg total) by mouth at bedtime as needed for muscle spasms. Patient not taking: Reported on 08/14/2018 08/16/17   Cristina Gong, PA-C  famotidine (PEPCID) 20 MG tablet Take 1 tablet (20 mg total) by mouth 2 (two) times daily. Patient not taking: Reported on 08/14/2018 08/01/17   Lorre Nick, MD  methocarbamol (ROBAXIN) 500 MG tablet Take 1 tablet (500 mg total) by mouth 2 (two) times daily. 09/12/19   Maxwell Caul, PA-C  norethindrone (CAMILA) 0.35 MG tablet Take 1 tablet (0.35 mg total) by mouth daily. Patient not taking: Reported on 11/12/2016 07/11/16   Katrinka Blazing, [place], CNM  ondansetron (ZOFRAN ODT) 4 MG disintegrating tablet Take 1 tablet (4 mg total) by mouth every 8 (eight) hours as needed for nausea or vomiting. Patient not taking: Reported on 08/01/2017 11/12/16   Dietrich Pates, PA-C  sucralfate (CARAFATE) 1 g tablet Take 1 tablet (1 g total) by mouth 4 (four) times daily -  with meals and at bedtime. Patient not taking: Reported on 08/16/2017 08/01/17   Lorre Nick, MD       Allergies    Gelatin    Review of Systems   Review of Systems  Constitutional:  Negative for fever.  HENT:  Negative for ear pain.   Eyes:  Negative for pain.  Respiratory:  Negative for cough.   Cardiovascular:  Positive for chest pain.  Gastrointestinal:  Positive for abdominal pain.  Genitourinary:  Negative for flank pain.  Musculoskeletal:  Negative for back pain.  Skin:  Negative for rash.  Neurological:  Negative for headaches.   Physical Exam Updated Vital Signs BP (!) 142/79   Pulse 80   Temp 98.2 F (36.8 C) (Oral)   Resp 16   Ht 5\' 6"  (1.676 m)   SpO2 100%   BMI 26.63 kg/m  Physical Exam Constitutional:      General: She is not in acute distress.    Appearance: Normal appearance.  HENT:     Head: Normocephalic.     Nose: Nose normal.  Eyes:     Extraocular Movements: Extraocular movements intact.  Cardiovascular:     Rate and Rhythm: Normal rate.  Pulmonary:     Effort: Pulmonary effort is normal.  Abdominal:     Tenderness: There is abdominal tenderness.     Comments: Left lower quadrant tenderness.  No guarding or rebound noted.  Musculoskeletal:        General: Normal range of motion.     Cervical back: Normal range  of motion.  Neurological:     General: No focal deficit present.     Mental Status: She is alert. Mental status is at baseline.    ED Results / Procedures / Treatments   Labs (all labs ordered are listed, but only abnormal results are displayed) Labs Reviewed  CBC - Abnormal; Notable for the following components:      Result Value   Hemoglobin 10.5 (*)    HCT 33.2 (*)    All other components within normal limits  COMPREHENSIVE METABOLIC PANEL - Abnormal; Notable for the following components:   Glucose, Bld 153 (*)    Calcium 8.8 (*)    All other components within normal limits  URINALYSIS, ROUTINE W REFLEX MICROSCOPIC - Abnormal; Notable for the following components:   Color, Urine COLORLESS (*)    Specific Gravity,  Urine 1.035 (*)    All other components within normal limits  LIPASE, BLOOD  I-STAT BETA HCG BLOOD, ED ([facility], [facility], [facility] ONLY)  TROPONIN I (HIGH SENSITIVITY)  TROPONIN I (HIGH SENSITIVITY)    EKG EKG Interpretation  Date/Time:  Saturday Jun 19 2021 07:44:53 EDT Ventricular Rate:  84 PR Interval:  121 QRS Duration: 90 QT Interval:  357 QTC Calculation: 422 R Axis:   56 Text Interpretation: Sinus rhythm Abnormal R-wave progression, early transition Confirmed by Norman Clay ([id number]) on 06/19/2021 9:43:20 AM  Radiology DG Chest 2 View  Result Date: 06/19/2021 CLINICAL DATA:  Chest pain EXAM: CHEST - 2 VIEW COMPARISON:  08/16/2017 FINDINGS: Artifact from EKG leads. Normal heart size and mediastinal contours. No acute infiltrate or edema. No effusion or pneumothorax. No acute osseous findings. IMPRESSION: No active cardiopulmonary disease. Electronically Signed   By: Tiburcio Pea M.D.   On: 06/19/2021 08:43   CT Abdomen Pelvis W Contrast  Result Date: 06/19/2021 CLINICAL DATA:  Abdominal pain. EXAM: CT ABDOMEN AND PELVIS WITH CONTRAST TECHNIQUE: Multidetector CT imaging of the abdomen and pelvis was performed using the standard protocol following bolus administration of intravenous contrast. RADIATION DOSE REDUCTION: This exam was performed according to the departmental dose-optimization program which includes automated exposure control, adjustment of the mA and/or kV according to patient size and/or use of iterative reconstruction technique. CONTRAST:  OMNIPAQUE IOHEXOL 300 MG/ML  SOLN COMPARISON:  02/19/2021 FINDINGS: Lower chest: No acute abnormality. Hepatobiliary: No focal liver abnormality is seen. No gallstones, gallbladder wall thickening, or biliary dilatation. Pancreas: Unremarkable. No pancreatic ductal dilatation or surrounding inflammatory changes. Spleen: Normal in size without focal abnormality. Adrenals/Urinary Tract: Normal adrenal glands. No kidney mass or signs of  hydronephrosis. There is excreted intravenous contrast material within the collecting systems an ureters of both kidneys. This obscures visualization previously noted small renal calculi. No signs of hydroureter. Urinary bladder appears normal. Stomach/Bowel: Stomach is within normal limits. Appendix appears normal. No evidence of bowel wall thickening, distention, or inflammatory changes. Vascular/Lymphatic: No significant vascular findings are present. No enlarged abdominal or pelvic lymph nodes. Reproductive: Uterus and bilateral adnexa are unremarkable. Other: No free fluid or fluid collection. No signs of pneumomediastinum. Musculoskeletal: No acute or significant osseous findings. IMPRESSION: 1. No acute findings within the abdomen or pelvis. 2. There is excreted intravenous contrast material within the collecting systems and ureters of both kidneys. Although there are no signs to suggest hydronephrosis or hydroureter presence of contrast material within the collecting systems an ureters may diminish sensitivity for detecting small ureteral calculi. Electronically Signed   By: Signa Kell M.D.   On: 06/19/2021 10:20  Procedures Procedures    Medications Ordered in ED Medications  sodium chloride (PF) 0.9 % injection (has no administration in time range)  iohexol (OMNIPAQUE) 300 MG/ML solution 100 mL (100 mLs Intravenous Contrast Given 06/19/21 0930)    ED Course/ Medical Decision Making/ A&P                           Medical Decision Making Amount and/or Complexity of Data Reviewed Labs: ordered. Radiology: ordered.  Risk Prescription drug management.   Daughter at bedside translating.  History obtained from daughter.  Cardiac monitoring shows sinus rhythm.  Review of records shows prior visit in January 2023 for left-sided abdominal pain.  Patient declined pain medications here.  Labs are sent, white count 7 hemoglobin 10 chemistry unremarkable lipase normal troponin is  negative.  CT abdomen pelvis pursued with no acute findings per radiology.  Etiology of her symptoms unclear, recommending outpatient follow with her doctor this week.  Recommending immediate return for worsening symptoms fevers or any additional concerns.          Final Clinical Impression(s) / ED Diagnoses Final diagnoses:  Chest pain, unspecified type  Abdominal pain, unspecified abdominal location    Rx / DC Orders ED Discharge Orders     None         Cheryll CockayneHong, Jerah Esty S, MD 06/19/21 1159

## 2021-06-19 NOTE — Discharge Instructions (Addendum)
Your test did not show a cause for your pain.  Follow-up with your primary care doctor and cardiologist within the week.  Return if you have worsening symptoms fevers or any additional concerns.

## 2021-06-29 ENCOUNTER — Ambulatory Visit (INDEPENDENT_AMBULATORY_CARE_PROVIDER_SITE_OTHER): Payer: Medicaid Other | Admitting: Cardiology

## 2021-06-29 ENCOUNTER — Encounter: Payer: Self-pay | Admitting: Cardiology

## 2021-06-29 VITALS — BP 115/69 | HR 109 | Ht 67.0 in | Wt 169.8 lb

## 2021-06-29 DIAGNOSIS — O24414 Gestational diabetes mellitus in pregnancy, insulin controlled: Secondary | ICD-10-CM | POA: Diagnosis not present

## 2021-06-29 DIAGNOSIS — R079 Chest pain, unspecified: Secondary | ICD-10-CM | POA: Diagnosis not present

## 2021-06-29 NOTE — Progress Notes (Signed)
Primary Care Provider: Patient, No Pcp Per (Inactive) [facility] Cardiologist: Bryan Lemma, MD Electrophysiologist: None  Clinic Note: No chief complaint on file.   ===================================  ASSESSMENT/PLAN   Problem List Items Addressed This Visit     Gestational diabetes    Seemingly not a problem since her pregnancy.  This does put her at risk of diabetes in the future, but has not been evaluated far as I can tell.      Relevant Orders   EXERCISE TOLERANCE TEST (ETT)   Cardiac Stress Test: Informed Consent Details: Physician/Practitioner Attestation; Transcribe to consent form and obtain patient signature   Chest pain with low risk for cardiac etiology - Primary    Somewhat atypical sounding chest discomfort in a relatively healthy woman otherwise.  Unfortunately, our visit today was somewhat interrupted by her GI symptoms.  At this point I think she needs to recover from this GI issue prior to considering further cardiac evaluation.  I think based on the low risk nature of her symptoms and unlikeliness of it being cardiac there are safer starting with a GXT/ETT.  We can then see her back after this test      Relevant Orders   EXERCISE TOLERANCE TEST (ETT)   Cardiac Stress Test: Informed Consent Details: Physician/Practitioner Attestation; Transcribe to consent form and obtain patient signature    ===================================  HPI:    Michelle Scott is a 46 y.o. [place] female with PMH notable for Gestational Diabetes is being seen today for the evaluation of Chest Pain at the request of [place], Eustace Moore, MD -[facility] EDP. She is accompanied today by her husband who serves as her interpreter-they speak a [place] dialect and Arabic, but only speaks [place].  The content interpreter had already left because she arrived late.  Berta Slovacek was referred after her recent ER visit on 06/19/2021.  Recent Hospitalizations:  [facility]18/2021: [facility] Left-sided abdominal pain both upper and lower quadrant as well as left flank.  No associated bowel or bladder complaints. => Abdominal CT showed no gross abnormalities.  Unremarkable UA.  Patient left prior to final evaluation by MD. 06/19/2021: [facility] ER: Left-sided chest pain and less abdominal pain.  Noted symptoms off and on for several years.  Began that morning.  Persistent.  Not seem to be associated with anything.  Chest pain radiates down the left arm.  Noted abdominal tenderness on exam.  Again CT abdomen without any acute findings.  Ruled out for UA and ACS.  Reviewed  CV studies:    The following studies were reviewed today: (if available, images/films reviewed: From Epic Chart or Care Everywhere) None:  Interval History:   Michelle Scott presents here with her husband in traditional attire with burka/headscarf.  She is very quiet and distant.  The entire time during our visit she was clearly uncomfortable and had 2-3 visits to the restroom where she felt like she was getting her throw up or have diarrhea.  She is having significant abdominal pain.  Indicated having some low-grade fevers but not truly febrile.  She actually was not necessarily complaining of the chest pain that she had when she went to the ER just noticing more abdominal pain and nausea.  Pretty much, because of her GI symptoms with nausea and dry heaving as well as diarrhea (she went to the restroom twice during the consultation leaving her husband to discuss her symptoms with me), I was unable to really fully examine  her and determine the nature of her symptoms.  She has not really had the left-sided chest/upper abdominal pain that much since her ER visit.  This comes on intermittently and has been that way for several years now.-Says that it comes and goes can last anywhere from 25 minutes to couple hours sometimes she wakes up with the pain is also associated with active dry heaves and a  sense that she needs to throw up.  She says that when she feels this, her heart rate will go up but not otherwise.  She gets a little lightheaded but no syncope or near syncope.  No TIA or amaurosis fugax.  No claudication  REVIEWED OF SYSTEMS   Review of Systems  Constitutional:  Positive for malaise/fatigue (She feels very bad today). Negative for weight loss.  HENT:  Negative for nosebleeds.   Respiratory:  Negative for cough and shortness of breath.   Cardiovascular:  Negative for leg swelling.  Gastrointestinal:  Positive for abdominal pain, nausea and vomiting. Negative for blood in stool, diarrhea (More just loose stools) and melena.  Genitourinary:  Negative for dysuria, frequency and hematuria.  Musculoskeletal: Negative.   Neurological:  Positive for dizziness and weakness. Negative for focal weakness.  Psychiatric/Behavioral:  Negative for depression and memory loss. The patient is nervous/anxious. The patient does not have insomnia.    I have reviewed and (if needed) personally updated the patient's problem list, medications, allergies, past medical and surgical history, social and family history.   PAST MEDICAL HISTORY   Past Medical History:  Diagnosis Date   Anemia    Gestational diabetes    UTI (urinary tract infection)     PAST SURGICAL HISTORY   Past Surgical History:  Procedure Laterality Date   NO PAST SURGERIES      Immunization History  Administered Date(s) Administered   Influenza,inj,Quad PF,6+ Mos 10/15/2015   Tdap 03/14/2016    MEDICATIONS/ALLERGIES   Current Meds  Medication Sig   acetaminophen (TYLENOL) 500 MG tablet Take 500 mg by mouth every 6 (six) hours as needed for mild pain.    Allergies  Allergen Reactions   Gelatin     CANNOT USE IT PER SPOUSE    SOCIAL HISTORY/FAMILY HISTORY   Reviewed in Epic:   Social History   Tobacco Use   Smoking status: Never   Smokeless tobacco: Never  Substance Use Topics   Alcohol use: No    Drug use: No   Social History   Social History Narrative   Not on file   Family History  Problem Relation Age of Onset   Hypertension Mother    Diabetes Mother     OBJCTIVE -PE, EKG, labs   Wt Readings from Last 3 Encounters:  06/29/21 169 lb 12.8 oz (77 kg)  02/18/21 165 lb (74.8 kg)  08/14/18 165 lb (74.8 kg)    Physical Exam: BP 115/69   Pulse (!) 109   Ht 5\' 7"  (1.702 m)   Wt 169 lb 12.8 oz (77 kg)   SpO2 97%   BMI 26.59 kg/m  Physical Exam Vitals (Exam limited by her head scarf and dress making it difficult to auscultate) reviewed.  Constitutional:      General: She is not in acute distress (Not in acute distress as far as toxic, but definitely uncomfortable.).    Appearance: She is ill-appearing (She definitely was uncomfortable nauseated look unwell.  Very distracted and not paying attention.  Not interested in chest pain at this time.).  She is not toxic-appearing.  HENT:     Head: Normocephalic and atraumatic.  Neck:     Vascular: No carotid bruit.  Cardiovascular:     Rate and Rhythm: Normal rate and regular rhythm.     Pulses: Normal pulses.     Heart sounds: Normal heart sounds. No murmur heard.    No friction rub. No gallop.  Pulmonary:     Effort: Pulmonary effort is normal. No respiratory distress.     Breath sounds: Normal breath sounds. No wheezing, rhonchi or rales.  Chest:     Chest wall: No tenderness.  Abdominal:     General: Abdomen is flat. There is no distension.     Palpations: Abdomen is soft.     Tenderness: There is abdominal tenderness in the epigastric area, periumbilical area, left upper quadrant and left lower quadrant. There is no guarding or rebound.     Hernia: No hernia is present.     Comments: Hyperactive bowel sounds  Musculoskeletal:        General: No swelling, tenderness, deformity or signs of injury.     Cervical back: Normal range of motion and neck supple.  Skin:    General: Skin is warm and dry.     Coloration:  Skin is not jaundiced.  Neurological:     General: No focal deficit present.     Mental Status: She is alert and oriented to person, place, and time.     Gait: Gait normal.  Psychiatric:     Comments: Very subdued and distracted.     Adult ECG Report From ER  Rate: 84;  Rhythm: normal sinus rhythm and Early transition of R waves, otherwise normal axis, normal durations. ;   Narrative Interpretation: Normal  Recent Labs: Reviewed No results found for: "CHOL", "HDL", "LDLCALC", "LDLDIRECT", "TRIG", "CHOLHDL" Lab Results  Component Value Date   CREATININE 0.83 06/19/2021   BUN 10 06/19/2021   NA 137 06/19/2021   K 3.9 06/19/2021   CL 105 06/19/2021   CO2 27 06/19/2021      Latest Ref Rng & Units 06/19/2021    8:04 AM 02/18/2021    7:32 PM 09/12/2019    6:03 AM  CBC  WBC 4.0 - 10.5 K/uL 7.7  9.5  9.5   Hemoglobin 12.0 - 15.0 g/dL 70.6  23.7  62.8   Hematocrit 36.0 - 46.0 % 33.2  37.2  34.5   Platelets 150 - 400 K/uL 346  420  385     No results found for: "HGBA1C" No results found for: "TSH"  ==================================================  COVID-19 Education: The signs and symptoms of COVID-19 were discussed with the patient and how to seek care for testing (follow up with PCP or arrange E-visit).    I spent a total of 28 minutes with the patient spent in direct patient consultation.  Additional time spent with chart review  / charting (studies, outside notes, etc): 15 min Total Time: 43 min  Current medicines are reviewed at length with the patient today.  (+/- concerns) N/A  Notice: This dictation was prepared with Dragon dictation along with smart phrase technology. Any transcriptional errors that result from this process are unintentional and may not be corrected upon review.   Studies Ordered:  Orders Placed This Encounter  Procedures   Cardiac Stress Test: Informed Consent Details: Physician/Practitioner Attestation; Transcribe to consent form and obtain  patient signature   EXERCISE TOLERANCE TEST (ETT)   No orders of the defined types  were placed in this encounter.   Patient Instructions / Medication Changes & Studies & Tests Ordered   Patient Instructions  Medication Instructions:   Not needed *If you need a refill on your cardiac medications before your next appointment, please call your pharmacy*   Lab Work: Not needed    Testing/Procedures:  Will schedule  exercise tolerance test at   Follow-Up: At [facility], you and your health needs are our priority.  As part of our continuing mission to provide you with exceptional heart care, we have created designated Provider Care Teams.  These Care Teams include your primary Cardiologist (physician) and Advanced Practice Providers (APPs -  Physician Assistants and Nurse Practitioners) who all work together to provide you with the care you need, when you need it.     Your next appointment:   1 month(s)  The format for your next appointment:   In Person  Provider:   Bryan Lemma, MD    Other Instructions   The stress test may involve walking on a treadmill, or if you are unable to exercise adequately, .  If you are pregnant or breastfeeding,  please notify the staff prior to your test.  How to prepare for your test: Do not eat or drink 2 hours prior to your test Do not consume products containing caffeine 12 hours prior to your test (examples: coffee (regular OR decaf), chocolate, sodas, tea) Your doctor may need you to hold certain medications prior to the test.  If so, these are listed below and should not be taken for 24 hours prior to the test.  If not listed below, you may take your medications as normal.  You may resume taking held medications on your normal schedule once the test is complete.   Meds to hold: none Do bring a list of your current medications with you.  If you have held any meds in preparation for the test, please bring them, as you may be required to  take them once the test is completed. Do wear comfortable clothes and walking shoes.  Do not wear dresses or overalls. Do NOT wear cologne, perfume, aftershave, or fragranced lotions the day of your test (deodorants okay). If these instructions are not followed your test will have to be rescheduled.   A nuclear cardiologist will review your test, prepare a report and send it to your physician.   If you have questions or concerns about your appointment, you can call the Nuclear Cardiology department at [phone]. If you cannot keep your appointment, please provide 48 hours notification to avoid a possible $50.00 charge to your account.   Please arrive 15 minutes prior to your appointment time for registration and insurance purposes     Bryan Lemma, M.D., M.S. Interventional Cardiologist   Pager # [phone] Phone # [phone] [address] [place], [place] [address]   Thank you for choosing Heartcare at [facility]!!

## 2021-06-29 NOTE — Patient Instructions (Addendum)
Medication Instructions:   Not needed *If you need a refill on your cardiac medications before your next appointment, please call your pharmacy*   Lab Work: Not needed    Testing/Procedures:  Will schedule  exercise tolerance test at   Follow-Up: At [facility], you and your health needs are our priority.  As part of our continuing mission to provide you with exceptional heart care, we have created designated Provider Care Teams.  These Care Teams include your primary Cardiologist (physician) and Advanced Practice Providers (APPs -  Physician Assistants and Nurse Practitioners) who all work together to provide you with the care you need, when you need it.     Your next appointment:   1 month(s)  The format for your next appointment:   In Person  Provider:   Bryan Lemma, MD    Other Instructions   The stress test may involve walking on a treadmill, or if you are unable to exercise adequately, .  If you are pregnant or breastfeeding,  please notify the staff prior to your test.  How to prepare for your test: Do not eat or drink 2 hours prior to your test Do not consume products containing caffeine 12 hours prior to your test (examples: coffee (regular OR decaf), chocolate, sodas, tea) Your doctor may need you to hold certain medications prior to the test.  If so, these are listed below and should not be taken for 24 hours prior to the test.  If not listed below, you may take your medications as normal.  You may resume taking held medications on your normal schedule once the test is complete.   Meds to hold: none Do bring a list of your current medications with you.  If you have held any meds in preparation for the test, please bring them, as you may be required to take them once the test is completed. Do wear comfortable clothes and walking shoes.  Do not wear dresses or overalls. Do NOT wear cologne, perfume, aftershave, or fragranced lotions the day of your test  (deodorants okay). If these instructions are not followed your test will have to be rescheduled.   A nuclear cardiologist will review your test, prepare a report and send it to your physician.   If you have questions or concerns about your appointment, you can call the Nuclear Cardiology department at [phone]. If you cannot keep your appointment, please provide 48 hours notification to avoid a possible $50.00 charge to your account.   Please arrive 15 minutes prior to your appointment time for registration and insurance purposes

## 2021-07-04 NOTE — Assessment & Plan Note (Signed)
Somewhat atypical sounding chest discomfort in a relatively healthy woman otherwise.  Unfortunately, our visit today was somewhat interrupted by her GI symptoms.  At this point I think she needs to recover from this GI issue prior to considering further cardiac evaluation.  I think based on the low risk nature of her symptoms and unlikeliness of it being cardiac there are safer starting with a GXT/ETT.  We can then see her back after this test

## 2021-07-04 NOTE — Assessment & Plan Note (Signed)
Seemingly not a problem since her pregnancy.  This does put her at risk of diabetes in the future, but has not been evaluated far as I can tell.

## 2021-07-08 ENCOUNTER — Encounter (HOSPITAL_COMMUNITY): Payer: Self-pay | Admitting: Cardiology

## 2021-07-21 ENCOUNTER — Telehealth (HOSPITAL_COMMUNITY): Payer: Self-pay | Admitting: Cardiology

## 2021-07-21 NOTE — Telephone Encounter (Signed)
Just an FYI. We have made several attempts to contact this patient including sending a letter to schedule or reschedule their GXT. We will be removing the patient from the echo WQ.   MAILED LETTER LBW  07/08/21 [facility] to schedule @ 10:05/LBW  07/06/21 [facility] to schedule x2 @ 9:36/LBW  LMOM,RE:SCHEDULING APPOINT/NEEDS F/U IN JULY W/DR.HARDING/D.MILLER   Thank you

## 2021-07-21 NOTE — Telephone Encounter (Signed)
OK sounds fine 

## 2021-11-17 ENCOUNTER — Emergency Department (HOSPITAL_COMMUNITY)
Admission: EM | Admit: 2021-11-17 | Discharge: 2021-11-17 | Disposition: A | Discharge status: Home / Self Care | Payer: Medicaid Other | Attending: Emergency Medicine | Admitting: Emergency Medicine

## 2021-11-17 ENCOUNTER — Encounter (HOSPITAL_COMMUNITY): Payer: Self-pay

## 2021-11-17 ENCOUNTER — Other Ambulatory Visit: Payer: Self-pay

## 2021-11-17 DIAGNOSIS — M25541 Pain in joints of right hand: Secondary | ICD-10-CM | POA: Diagnosis not present

## 2021-11-17 DIAGNOSIS — R202 Paresthesia of skin: Secondary | ICD-10-CM | POA: Diagnosis not present

## 2021-11-17 DIAGNOSIS — D649 Anemia, unspecified: Secondary | ICD-10-CM | POA: Diagnosis not present

## 2021-11-17 DIAGNOSIS — R7309 Other abnormal glucose: Secondary | ICD-10-CM | POA: Insufficient documentation

## 2021-11-17 DIAGNOSIS — M25542 Pain in joints of left hand: Secondary | ICD-10-CM | POA: Insufficient documentation

## 2021-11-17 DIAGNOSIS — M2550 Pain in unspecified joint: Secondary | ICD-10-CM

## 2021-11-17 LAB — CBC WITH DIFFERENTIAL/PLATELET
Abs Immature Granulocytes: 0.01 10*3/uL (ref 0.00–0.07)
Basophils Absolute: 0 10*3/uL (ref 0.0–0.1)
Basophils Relative: 1 %
Eosinophils Absolute: 0.1 10*3/uL (ref 0.0–0.5)
Eosinophils Relative: 1 %
HCT: 36.6 % (ref 36.0–46.0)
Hemoglobin: 11.6 g/dL — ABNORMAL LOW (ref 12.0–15.0)
Immature Granulocytes: 0 %
Lymphocytes Relative: 44 %
Lymphs Abs: 3.2 10*3/uL (ref 0.7–4.0)
MCH: 26.5 pg (ref 26.0–34.0)
MCHC: 31.7 g/dL (ref 30.0–36.0)
MCV: 83.6 fL (ref 80.0–100.0)
Monocytes Absolute: 0.4 10*3/uL (ref 0.1–1.0)
Monocytes Relative: 5 %
Neutro Abs: 3.6 10*3/uL (ref 1.7–7.7)
Neutrophils Relative %: 49 %
Platelets: 381 10*3/uL (ref 150–400)
RBC: 4.38 MIL/uL (ref 3.87–5.11)
RDW: 14.8 % (ref 11.5–15.5)
WBC: 7.3 10*3/uL (ref 4.0–10.5)
nRBC: 0 % (ref 0.0–0.2)

## 2021-11-17 LAB — COMPREHENSIVE METABOLIC PANEL
ALT: 16 U/L (ref 0–44)
AST: 19 U/L (ref 15–41)
Albumin: 4 g/dL (ref 3.5–5.0)
Alkaline Phosphatase: 70 U/L (ref 38–126)
Anion gap: 9 (ref 5–15)
BUN: 12 mg/dL (ref 6–20)
CO2: 24 mmol/L (ref 22–32)
Calcium: 9.1 mg/dL (ref 8.9–10.3)
Chloride: 104 mmol/L (ref 98–111)
Creatinine, Ser: 0.58 mg/dL (ref 0.44–1.00)
GFR, Estimated: >60 mL/min (ref >60)
Glucose, Bld: 116 mg/dL — ABNORMAL HIGH (ref 70–99)
Potassium: 4 mmol/L (ref 3.5–5.1)
Sodium: 137 mmol/L (ref 135–145)
Total Bilirubin: 0.5 mg/dL (ref 0.3–1.2)
Total Protein: 8.1 g/dL (ref 6.5–8.1)

## 2021-11-17 LAB — URINALYSIS, ROUTINE W REFLEX MICROSCOPIC
Bilirubin Urine: NEGATIVE
Glucose, UA: NEGATIVE mg/dL
Hgb urine dipstick: NEGATIVE
Ketones, ur: NEGATIVE mg/dL
Leukocytes,Ua: NEGATIVE
Nitrite: NEGATIVE
Protein, ur: NEGATIVE mg/dL
Specific Gravity, Urine: 1.023 (ref 1.005–1.030)
pH: 5 (ref 5.0–8.0)

## 2021-11-17 LAB — VITAMIN B12: Vitamin B-12: 350 pg/mL (ref 180–914)

## 2021-11-17 LAB — CBG MONITORING, ED: Glucose-Capillary: 111 mg/dL — ABNORMAL HIGH (ref 70–99)

## 2021-11-17 LAB — MAGNESIUM: Magnesium: 1.9 mg/dL (ref 1.7–2.4)

## 2021-11-17 LAB — PREGNANCY, URINE: Preg Test, Ur: NEGATIVE

## 2021-11-17 MED ORDER — IBUPROFEN 800 MG PO TABS
800.0000 mg | ORAL_TABLET | Freq: Once | ORAL | Status: AC
  Administered 2021-11-17: 800 mg via ORAL
  Filled 2021-11-17: qty 1

## 2021-11-17 NOTE — Progress Notes (Signed)
Transition of Care [facility]) - Emergency Department Mini Assessment   Patient Details  Name: Michelle Scott MRN: [id number] Date of Birth: 1975/11/02  Transition of Care [facility] CM/SW Contact:    Kimber Relic, LCSW Phone Number: 11/17/2021, 1:41 PM   Clinical Narrative: TOC consulted to schedule a PCP appointment. Pt was scheduled an appointment with [facility] on December 02, 2021 at 10:15 AM. Information added to the pt's AVS. Spouse also notified.    ED Mini Assessment: What brought you to the Emergency Department? : Blood sugar checked  Barriers to Discharge: No Barriers Identified     Means of departure: Car       Patient Contact and Communications Key Contact 1: Patient Key Contact 2: Spouse Spoke with: Spouse Contact Date: 11/17/21,   Contact time: [place] Phone Number: [phone] Call outcome: Provided information regarding appointment         Admission diagnosis:  Diabetes check Patient Active Problem List   Diagnosis Date Noted   Chest pain with low risk for cardiac etiology 06/29/2021   Gestational diabetes 04/01/2016   Female genital mutilation 10/15/2015   Language barrier 10/15/2015   MALARIA, HX OF 05/04/2009   PCP:  Patient, No Pcp Per Pharmacy:   [facility] [place], [place] [address] AT [facility] [address] [place] [address] Phone: [phone] Fax: [phone]

## 2021-11-17 NOTE — ED Triage Notes (Signed)
Pt to er, pt states that she is here to have her blood sugar checked.  States that she doesn't check her sugars are home.  Pt states that everything else is normal

## 2021-11-17 NOTE — ED Provider Notes (Incomplete)
  [place] [facility] DEPT Provider Note   CSN: [id number] Arrival date & time: 11/17/21  0754     History {Add pertinent medical, surgical, social history, OB history to HPI:1} Chief Complaint  Patient presents with   Blood Sugar Problem    Shanay Podolak is a 46 y.o. female.  HPI     Home Medications Prior to Admission medications   Medication Sig Start Date End Date Taking? Authorizing Provider  acetaminophen (TYLENOL) 500 MG tablet Take 500 mg by mouth every 6 (six) hours as needed for mild pain.    [provider]      Allergies    Gelatin    Review of Systems   Review of Systems  Physical Exam Updated Vital Signs BP 129/73 (BP Location: Left Arm)   Pulse 70   Temp 98.1 F (36.7 C) (Oral)   Resp 18   Ht 5\' 3"  (1.6 m)   Wt 74.8 kg   SpO2 98%   BMI 29.23 kg/m  Physical Exam  ED Results / Procedures / Treatments   Labs (all labs ordered are listed, but only abnormal results are displayed) Labs Reviewed  URINALYSIS, ROUTINE W REFLEX MICROSCOPIC - Abnormal; Notable for the following components:      Result Value   APPearance HAZY (*)    All other components within normal limits  PREGNANCY, URINE  CBC WITH DIFFERENTIAL/PLATELET  COMPREHENSIVE METABOLIC PANEL  MAGNESIUM  VITAMIN B12  CBG MONITORING, ED    EKG None  Radiology No results found.  Procedures Procedures  {Document cardiac monitor, telemetry assessment procedure when appropriate:1}  Medications Ordered in ED Medications - No data to display  ED Course/ Medical Decision Making/ A&P                           Medical Decision Making  ***  {Document critical care time when appropriate:1} {Document review of labs and clinical decision tools ie heart score, Chads2Vasc2 etc:1}  {Document your independent review of radiology images, and any outside records:1} {Document your discussion with family members, caretakers, and with consultants:1} {Document  social determinants of health affecting pt's care:1} {Document your decision making why or why not admission, treatments were needed:1} Final Clinical Impression(s) / ED Diagnoses Final diagnoses:  None    Rx / DC Orders ED Discharge Orders     None

## 2021-11-17 NOTE — ED Provider Triage Note (Signed)
Emergency Medicine Provider Triage Evaluation Note  Michelle Scott , a 46 y.o. female  was evaluated in triage.  Pt complains of bilateral hands and feet tingling for weeks. She would like her blood sugar checked because she thinks she has diabetes. Reports s dry throat, but denies any polyuria or polydipsia.  Review of Systems  Positive:  Negative:   Physical Exam  BP 129/73 (BP Location: Left Arm)   Pulse 70   Temp 98.1 F (36.7 C) (Oral)   Resp 18   Ht 5\' 3"  (1.6 m)   Wt 74.8 kg   SpO2 98%   BMI 29.23 kg/m  Gen:   Awake, no distress   Resp:  Normal effort  MSK:   Moves extremities without difficulty  Other:    Medical Decision Making  Medically screening exam initiated at 8:31 AM.  Appropriate orders placed.  Michelle Scott was informed that the remainder of the evaluation will be completed by another provider, this initial triage assessment does not replace that evaluation, and the importance of remaining in the ED until their evaluation is complete.  Will order labs   Sherrell Puller, [place] 11/17/21 [id number]

## 2021-12-02 ENCOUNTER — Ambulatory Visit: Payer: Medicaid Other | Admitting: Student

## 2021-12-02 ENCOUNTER — Ambulatory Visit (HOSPITAL_COMMUNITY)
Admission: EM | Admit: 2021-12-02 | Discharge: 2021-12-02 | Discharge status: Against Medical Advice | Payer: Medicaid Other

## 2021-12-09 ENCOUNTER — Encounter: Payer: Self-pay | Admitting: Student

## 2021-12-09 ENCOUNTER — Other Ambulatory Visit: Payer: Self-pay

## 2021-12-09 ENCOUNTER — Ambulatory Visit: Payer: Medicaid Other | Admitting: Student

## 2021-12-09 VITALS — BP 131/71 | HR 70 | Temp 98.1°F | Ht 67.0 in | Wt 173.9 lb

## 2021-12-09 DIAGNOSIS — R109 Unspecified abdominal pain: Secondary | ICD-10-CM

## 2021-12-09 DIAGNOSIS — Z7689 Persons encountering health services in other specified circumstances: Secondary | ICD-10-CM

## 2021-12-09 DIAGNOSIS — D649 Anemia, unspecified: Secondary | ICD-10-CM

## 2021-12-09 DIAGNOSIS — M2550 Pain in unspecified joint: Secondary | ICD-10-CM | POA: Diagnosis present

## 2021-12-09 DIAGNOSIS — Z1211 Encounter for screening for malignant neoplasm of colon: Secondary | ICD-10-CM | POA: Diagnosis not present

## 2021-12-09 NOTE — Progress Notes (Signed)
CC: Establish care, polyarthralgias, chronic left abdominal pain  HPI:  [name] is a 47 y.o. female living with a history stated below and presents today for establishing care with concerns for polyarthralgias and chronic left abdominal pain. Please see problem based assessment and plan for additional details.  In-person interpreter was present during encounter today. Also, patient and husband requests that future visits be with a female provider especially for certain parts of physical exam.   PMH: Past Medical History:  Diagnosis Date   Anemia    Gestational diabetes    UTI (urinary tract infection)    PSH: Past Surgical History:  Procedure Laterality Date   NO PAST SURGERIES     Meds: Current Outpatient Medications on File Prior to Visit  Medication Sig Dispense Refill   acetaminophen (TYLENOL) 500 MG tablet Take 500 mg by mouth every 6 (six) hours as needed for mild pain.     No current facility-administered medications on file prior to visit.   Allergies: Allergies  Allergen Reactions   Gelatin     CANNOT USE IT PER SPOUSE    FH: Family History  Problem Relation Age of Onset   Hypertension Mother    Diabetes Mother    SH: Social History   Socioeconomic History   Marital status: Married    Spouse name: Not on file   Number of children: Not on file   Years of education: Not on file   Highest education level: Not on file  Occupational History   Not on file  Tobacco Use   Smoking status: Never   Smokeless tobacco: Never  Substance and Sexual Activity   Alcohol use: No   Drug use: No   Sexual activity: Never    Birth control/protection: None    Comment: Patient would like pills  Other Topics Concern   Not on file  Social History Narrative   Not on file   Social Determinants of Health   Financial Resource Strain: Not on file  Food Insecurity: Not on file  Transportation Needs: Not on file  Physical Activity: Not on file  Stress: Not on  file  Social Connections: Not on file  Intimate Partner Violence: Not on file    Review of Systems: ROS negative except for what is noted on the assessment and plan.  Vitals:   12/09/21 1019 12/09/21 1020  BP:  131/71  Pulse:  70  Temp:  98.1 F (36.7 C)  TempSrc:  Oral  SpO2:  100%  Weight: 173 lb 14.4 oz (78.9 kg)   Height: _0  (1.702 m)    Physical Exam Physical Exam: Constitutional: well-appearing female, sitting in chair, in no acute distress HENT: normocephalic atraumatic Neck: supple Cardiovascular: regular rate and rhythm, no m/r/g Pulmonary/Chest: normal work of breathing on room air Abdominal: soft, non-distended, tenderness in LUQ and LLQ, no guarding or rebound MSK:   Right Hand: no joint swelling of any MCP, PIP or DIP, no erythema, lesions or deformity  Left Hand: no joint swelling of any MCP, PIP or DIP, no erythema, lesions or deformity  Right Foot: no joint swelling of any MTP, PIP or DIP, no erythema, lesions or deformity  Left Foot:no joint swelling of any MTP, PIP or DIP, no erythema, lesions or deformity Neurological: alert & oriented x 3 Skin: warm and dry Psych: normal mood and behavior  Assessment & Plan:   Left sided abdominal pain Patient reports chronic and intermittent abdominal pain for past 5 years. Pain  is sharp and localized to the left side of abdomen. Pain can last up to a day. Not associated with eating or bowel movement. Denies hematochezia or melena. Denies n/v/d or constipation. She reports daily bowel movements. No hx of abdominal surgery or stomach ulcers.  Previous CT A/P did not show any acute processes. CT renal from before did show bilateral non-obstructing calculi. Exam today showed diffuse tenderness of left abdomen but no guarding or rebound. Recent labs did show mild anemia. Will further evaluate anemia and also check for H. pylori. Patient denies taking any PPI, pepto or antibiotics in the past few weeks.   Plan -Iron panel  and ferritin today -H. pylori breath test today -Referral to GI  Anemia Patient presents with normocytic anemia. Denies noticing blood in stool or urine.Patient states menstrual cycles are not heavy and last a few days. Will obtain labs today.  Screening for colon cancer -referral to GI for colonoscopy  Polyarthralgia Patient presents with arthralgia of both hands and feet for past 1-2 months. She was seen in urgent care and given NSAIDs for joint pains/aches. She endorses pain in her fingers and toes mainly. No specific joint that hurts the most and is more the entire finger length. Denies swelling, redness, rashes or color changes of her hands or feet. No trauma or injuries. No paresthesia of hands or feet. Has tried tylenol and ibuprofen with relief of the pain. Exam today was does not significant joint swelling, erythema or deformities of the hands or feet. Considered RA or other autoimmune etiologies. Denies any family hx of autoimmune diseases. Will check inflammatory markers and rheumatoid markers today.   Plan -continue with conservative management since it has been helping -ESR and CRP today -ANA, RF, and anti-CCP today  Encounter to establish care Patient presents today to establish care at [facility]. Husband and in-person interpreter present during encounter. Patient and husband did request that future visits be with a female provider especially for parts of physical exam that requires exposing skin.   Patient seen with Dr. Alvin Critchley, D.O. [facility], PGY-1 Phone: [phone] Date 12/10/2021 Time 7:00 PM

## 2021-12-09 NOTE — Patient Instructions (Addendum)
Thank you, Ms.Eldena Elzey for allowing [place] to provide your care today.   Left Abdominal Pain -blood work to check your iron levels -breath test to check for H. pylori (bacteria that can cause abdominal pain) -referral to gastroenterology specialist   Hand and Feet Joint Pain -blood work today to check for autoimmune causes for your joint pains -you can continue taking tylenol and ibuprofen to help with pain    I have ordered the following labs for you:   Lab Orders         Rheumatoid (RA) Factor         Sed Rate (ESR)         CRP (C-Reactive Protein)         CYCLIC CITRUL PEPTIDE ANTIBODY, IGG/IGA         ANA, IFA (with reflex)         Iron, TIBC and Ferritin Panel         H. pylori breath test       Referrals ordered today:    Referral Orders         Ambulatory referral to Gastroenterology      I have ordered the following medication/changed the following medications:   Stop the following medications: There are no discontinued medications.   Start the following medications: No orders of the defined types were placed in this encounter.    Follow up:  1-3 months     Should you have any questions or concerns please call the internal medicine clinic at [phone].    Angelique Blonder, D.O. [place]

## 2021-12-10 DIAGNOSIS — Z7689 Persons encountering health services in other specified circumstances: Secondary | ICD-10-CM | POA: Insufficient documentation

## 2021-12-10 NOTE — Assessment & Plan Note (Signed)
Patient presents with arthralgia of both hands and feet for past 1-2 months. She was seen in urgent care and given NSAIDs for joint pains/aches. She endorses pain in her fingers and toes mainly. No specific joint that hurts the most and is more the entire finger length. Denies swelling, redness, rashes or color changes of her hands or feet. No trauma or injuries. No paresthesia of hands or feet. Has tried tylenol and ibuprofen with relief of the pain. Exam today was does not significant joint swelling, erythema or deformities of the hands or feet. Considered RA or other autoimmune etiologies. Denies any family hx of autoimmune diseases. Will check inflammatory markers and rheumatoid markers today.   Plan -continue with conservative management since it has been helping -ESR and CRP today -ANA, RF, and anti-CCP today

## 2021-12-10 NOTE — Assessment & Plan Note (Signed)
Patient presents today to establish care at [facility]. Husband and in-person interpreter present during encounter. Patient and husband did request that future visits be with a female provider especially for parts of physical exam that requires exposing skin.

## 2021-12-10 NOTE — Assessment & Plan Note (Addendum)
Patient reports chronic and intermittent abdominal pain for past 5 years. Pain is sharp and localized to the left side of abdomen. Pain can last up to a day. Not associated with eating or bowel movement. Denies hematochezia or melena. Denies n/v/d or constipation. She reports daily bowel movements. No hx of abdominal surgery or stomach ulcers.  Previous CT A/P did not show any acute processes. CT renal from before did show bilateral non-obstructing calculi. Exam today showed diffuse tenderness of left abdomen but no guarding or rebound. Recent labs did show mild anemia. Will further evaluate anemia and also check for H. pylori. Patient denies taking any PPI, pepto or antibiotics in the past few weeks.   Plan -Iron panel and ferritin today -H. pylori breath test today -Referral to GI

## 2021-12-10 NOTE — Assessment & Plan Note (Addendum)
Patient presents with normocytic anemia. Denies noticing blood in stool or urine.Patient states menstrual cycles are not heavy and last a few days. Will obtain labs today.

## 2021-12-10 NOTE — Assessment & Plan Note (Signed)
-   referral to GI for colonoscopy

## 2021-12-11 LAB — C-REACTIVE PROTEIN: CRP: 7 mg/L (ref 0–10)

## 2021-12-11 LAB — IRON,TIBC AND FERRITIN PANEL
Ferritin: 36 ng/mL (ref 15–150)
Iron Saturation: 13 % — ABNORMAL LOW (ref 15–55)
Iron: 43 ug/dL (ref 27–159)
Total Iron Binding Capacity: 327 ug/dL (ref 250–450)
UIBC: 284 ug/dL (ref 131–425)

## 2021-12-11 LAB — CYCLIC CITRUL PEPTIDE ANTIBODY, IGG/IGA: Cyclic Citrullin Peptide Ab: 32 units — ABNORMAL HIGH (ref 0–19)

## 2021-12-11 LAB — SEDIMENTATION RATE: Sed Rate: 17 mm/hr (ref 0–32)

## 2021-12-11 LAB — H. PYLORI BREATH TEST: H pylori Breath Test: NEGATIVE

## 2021-12-11 LAB — ANTINUCLEAR ANTIBODIES, IFA: ANA Titer 1: NEGATIVE

## 2021-12-11 LAB — RHEUMATOID FACTOR: Rheumatoid fact SerPl-aCnc: <10 IU/mL (ref <14.0)

## 2021-12-13 NOTE — Progress Notes (Signed)
Internal Medicine Clinic Attending  I saw and evaluated the patient.  I personally confirmed the key portions of the history and exam documented by Dr. Sherrilee Gilles and I reviewed pertinent patient test results.  The assessment, diagnosis, and plan were formulated together and I agree with the documentation in the resident's note.   Aside from a mildly elevated anti-CCP, other rheumatologic work up is negative and reassuring. Doubt inflammatory arthropathy.

## 2022-09-25 ENCOUNTER — Emergency Department (HOSPITAL_COMMUNITY): Payer: Medicaid Other

## 2022-09-25 ENCOUNTER — Other Ambulatory Visit: Payer: Self-pay

## 2022-09-25 ENCOUNTER — Emergency Department (HOSPITAL_COMMUNITY)
Admission: EM | Admit: 2022-09-25 | Discharge: 2022-09-25 | Disposition: A | Discharge status: Home / Self Care | Payer: Medicaid Other | Source: Home / Self Care

## 2022-09-25 DIAGNOSIS — R1011 Right upper quadrant pain: Secondary | ICD-10-CM | POA: Insufficient documentation

## 2022-09-25 DIAGNOSIS — R109 Unspecified abdominal pain: Secondary | ICD-10-CM | POA: Diagnosis present

## 2022-09-25 LAB — URINALYSIS, ROUTINE W REFLEX MICROSCOPIC
Bacteria, UA: NONE SEEN
Bilirubin Urine: NEGATIVE
Glucose, UA: NEGATIVE mg/dL
Hgb urine dipstick: NEGATIVE
Ketones, ur: NEGATIVE mg/dL
Nitrite: NEGATIVE
Protein, ur: NEGATIVE mg/dL
Specific Gravity, Urine: 1.016 (ref 1.005–1.030)
pH: 5 (ref 5.0–8.0)

## 2022-09-25 LAB — COMPREHENSIVE METABOLIC PANEL
ALT: 26 U/L (ref 0–44)
AST: 39 U/L (ref 15–41)
Albumin: 4.3 g/dL (ref 3.5–5.0)
Alkaline Phosphatase: 78 U/L (ref 38–126)
Anion gap: 11 (ref 5–15)
BUN: 11 mg/dL (ref 6–20)
CO2: 25 mmol/L (ref 22–32)
Calcium: 9.5 mg/dL (ref 8.9–10.3)
Chloride: 100 mmol/L (ref 98–111)
Creatinine, Ser: 0.75 mg/dL (ref 0.44–1.00)
GFR, Estimated: >60 mL/min (ref >60)
Glucose, Bld: 104 mg/dL — ABNORMAL HIGH (ref 70–99)
Potassium: 3.7 mmol/L (ref 3.5–5.1)
Sodium: 136 mmol/L (ref 135–145)
Total Bilirubin: 0.5 mg/dL (ref 0.3–1.2)
Total Protein: 8.8 g/dL — ABNORMAL HIGH (ref 6.5–8.1)

## 2022-09-25 LAB — CBC
HCT: 38.2 % (ref 36.0–46.0)
Hemoglobin: 12 g/dL (ref 12.0–15.0)
MCH: 26.4 pg (ref 26.0–34.0)
MCHC: 31.4 g/dL (ref 30.0–36.0)
MCV: 84 fL (ref 80.0–100.0)
Platelets: 414 10*3/uL — ABNORMAL HIGH (ref 150–400)
RBC: 4.55 MIL/uL (ref 3.87–5.11)
RDW: 15.3 % (ref 11.5–15.5)
WBC: 8.9 10*3/uL (ref 4.0–10.5)
nRBC: 0 % (ref 0.0–0.2)

## 2022-09-25 LAB — LIPASE, BLOOD: Lipase: 36 U/L (ref 11–51)

## 2022-09-25 LAB — HCG, SERUM, QUALITATIVE: Preg, Serum: NEGATIVE

## 2022-09-25 MED ORDER — FAMOTIDINE 20 MG PO TABS
20.0000 mg | ORAL_TABLET | Freq: Once | ORAL | Status: AC
  Administered 2022-09-25: 20 mg via ORAL
  Filled 2022-09-25: qty 1

## 2022-09-25 MED ORDER — PANTOPRAZOLE SODIUM 20 MG PO TBEC: 20.0000 mg | DELAYED_RELEASE_TABLET | Freq: Every day | ORAL | 0 refills | Status: AC

## 2022-09-25 MED ORDER — SODIUM CHLORIDE (PF) 0.9 % IJ SOLN
INTRAMUSCULAR | Status: AC
  Filled 2022-09-25: qty 50

## 2022-09-25 MED ORDER — ALUM & MAG HYDROXIDE-SIMETH 200-200-20 MG/5ML PO SUSP
30.0000 mL | Freq: Once | ORAL | Status: AC
  Administered 2022-09-25: 30 mL via ORAL
  Filled 2022-09-25: qty 30

## 2022-09-25 MED ORDER — LACTATED RINGERS IV BOLUS
500.0000 mL | Freq: Once | INTRAVENOUS | Status: AC
  Administered 2022-09-25: 500 mL via INTRAVENOUS

## 2022-09-25 MED ORDER — MORPHINE SULFATE (PF) 2 MG/ML IV SOLN
1.0000 mg | Freq: Once | INTRAVENOUS | Status: AC
  Administered 2022-09-25: 1 mg via INTRAVENOUS
  Filled 2022-09-25: qty 1

## 2022-09-25 MED ORDER — IOHEXOL 300 MG/ML  SOLN
100.0000 mL | Freq: Once | INTRAMUSCULAR | Status: AC | PRN
  Administered 2022-09-25: 100 mL via INTRAVENOUS

## 2022-09-25 MED ORDER — ONDANSETRON HCL 4 MG/2ML IJ SOLN
4.0000 mg | Freq: Once | INTRAMUSCULAR | Status: AC
  Administered 2022-09-25: 4 mg via INTRAVENOUS
  Filled 2022-09-25: qty 2

## 2022-09-25 NOTE — ED Provider Notes (Signed)
[place] EMERGENCY DEPARTMENT AT [facility] Provider Note   CSN: [id number] Arrival date & time: 09/25/22  1422     History  Chief Complaint  Patient presents with   Abdominal Pain    Michelle Scott is a 47 y.o. female.  47 year old female no prior surgical history presents emergency department for right sided abdominal pain.  Symptoms been ongoing for the past week.  She speaks Arabic and translated by family member.  Reports symptoms have been ongoing intermittently for the past 5 years.  They have typically been left-sided distal first time the pain is on the right side.  No associated fevers, chills, chest pain, shortness of breath.   Abdominal Pain      Home Medications Prior to Admission medications   Medication Sig Start Date End Date Taking? Authorizing Provider  pantoprazole (PROTONIX) 20 MG tablet Take 1 tablet (20 mg total) by mouth daily for 14 days. 09/25/22 10/09/22 Yes Morning Halberg, Harmon Dun, DO  acetaminophen (TYLENOL) 500 MG tablet Take 500 mg by mouth every 6 (six) hours as needed for mild pain.    [provider]      Allergies    Gelatin    Review of Systems   Review of Systems  Gastrointestinal:  Positive for abdominal pain.    Physical Exam Updated Vital Signs BP 125/74   Pulse 72   Temp 97.7 F (36.5 C)   Resp 16   Ht 5\' 7"  (1.702 m)   Wt 74.8 kg   SpO2 100%   BMI 25.84 kg/m  Physical Exam Vitals and nursing note reviewed.  HENT:     Head: Normocephalic.  Cardiovascular:     Rate and Rhythm: Normal rate and regular rhythm.  Pulmonary:     Effort: Pulmonary effort is normal.  Abdominal:     Tenderness: There is abdominal tenderness in the right upper quadrant, right lower quadrant and periumbilical area. There is no guarding or rebound.  Neurological:     General: No focal deficit present.     Mental Status: She is alert.  Psychiatric:        Mood and Affect: Mood normal.        Behavior: Behavior normal.     ED  Results / Procedures / Treatments   Labs (all labs ordered are listed, but only abnormal results are displayed) Labs Reviewed  COMPREHENSIVE METABOLIC PANEL - Abnormal; Notable for the following components:      Result Value   Glucose, Bld 104 (*)    Total Protein 8.8 (*)    All other components within normal limits  CBC - Abnormal; Notable for the following components:   Platelets 414 (*)    All other components within normal limits  URINALYSIS, ROUTINE W REFLEX MICROSCOPIC - Abnormal; Notable for the following components:   APPearance HAZY (*)    Leukocytes,Ua TRACE (*)    All other components within normal limits  LIPASE, BLOOD  HCG, SERUM, QUALITATIVE    EKG None  Radiology CT ABDOMEN PELVIS W CONTRAST  Result Date: 09/25/2022 CLINICAL DATA:  Right lower quadrant and right upper quadrant pain EXAM: CT ABDOMEN AND PELVIS WITH CONTRAST TECHNIQUE: Multidetector CT imaging of the abdomen and pelvis was performed using the standard protocol following bolus administration of intravenous contrast. RADIATION DOSE REDUCTION: This exam was performed according to the departmental dose-optimization program which includes automated exposure control, adjustment of the mA and/or kV according to patient size and/or use of iterative reconstruction technique.  CONTRAST:  OMNIPAQUE IOHEXOL 300 MG/ML  SOLN COMPARISON:  06/19/2021 FINDINGS: Lower chest: No acute abnormality Hepatobiliary: Diffuse low-density throughout the liver compatible with fatty infiltration. No focal abnormality. Gallbladder unremarkable. Pancreas: No focal abnormality or ductal dilatation. Spleen: No focal abnormality.  Normal size. Adrenals/Urinary Tract: No adrenal abnormality. No focal renal abnormality. No stones or hydronephrosis. Urinary bladder is unremarkable. Stomach/Bowel: Normal appendix. Stomach, large and small bowel grossly unremarkable. Vascular/Lymphatic: No evidence of aneurysm or adenopathy. Reproductive: Uterus  and adnexa unremarkable.  No mass. Other: No free fluid or free air. Musculoskeletal: No acute bony abnormality. IMPRESSION: Normal appendix. Mild fatty infiltration of the liver. No acute findings. Electronically Signed   By: Charlett Nose M.D.   On: 09/25/2022 20:18   US Abdomen Limited RUQ (LIVER/GB)  Result Date: 09/25/2022 CLINICAL DATA:  Right upper quad pain since yesterday EXAM: ULTRASOUND ABDOMEN LIMITED RIGHT UPPER QUADRANT COMPARISON:  06/19/2021 FINDINGS: Gallbladder: No gallstones or wall thickening visualized. No sonographic Murphy sign noted by sonographer. Common bile duct: Diameter: 2 mm Liver: Diffuse increased liver echotexture consistent with hepatic steatosis. Hypoechoic area near the gallbladder fossa measuring 2.3 x 3.4 x 0.9 cm, likely reflects focal fatty sparing. Portal vein is patent on color Doppler imaging with normal direction of blood flow towards the liver. Other: None. IMPRESSION: 1. Hepatic steatosis, with nonspecific hypoechoic area near the gallbladder fossa likely reflecting focal fatty sparing. 2. No evidence of cholelithiasis or cholecystitis. Electronically Signed   By: Sharlet Salina M.D.   On: 09/25/2022 19:12    Procedures Procedures    Medications Ordered in ED Medications  lactated ringers bolus 500 mL (0 mLs Intravenous Stopped 09/25/22 1908)  morphine (PF) 2 MG/ML injection 1 mg (1 mg Intravenous Given 09/25/22 1828)  ondansetron (ZOFRAN) injection 4 mg (4 mg Intravenous Given 09/25/22 1833)  alum & mag hydroxide-simeth (MAALOX/MYLANTA) 200-200-20 MG/5ML suspension 30 mL (30 mLs Oral Given 09/25/22 1907)  famotidine (PEPCID) tablet 20 mg (20 mg Oral Given 09/25/22 1907)  iohexol (OMNIPAQUE) 300 MG/ML solution 100 mL (100 mLs Intravenous Contrast Given 09/25/22 2010)    ED Course/ Medical Decision Making/ A&P Clinical Course as of 09/25/22 2233  Sun Sep 25, 2022  1839 US Abdomen Limited RUQ (LIVER/GB) Reviewed; no stones.  Bladder wall.  Normal with no  surrounding edema. [TY]  1842 CBC(!) No leukocytosis to suggest systemic infection. [TY]  1842 Comprehensive metabolic panel(!) No significant metabolic derangements.  No transaminitis to suggest hepatobiliary disease. [TY]  1843 Lipase: 36 Normal.  Pancreatitis unlikely [TY]  1843 Urinalysis, Routine w reflex microscopic -Urine, Clean Catch(!) Not consistent with UTI [TY]  1944 US Abdomen Limited RUQ (LIVER/GB) MPRESSION: 1. Hepatic steatosis, with nonspecific hypoechoic area near the gallbladder fossa likely reflecting focal fatty sparing. 2. No evidence of cholelithiasis or cholecystitis.   [TY]  2021 CT ABDOMEN PELVIS W CONTRAST IMPRESSION: Normal appendix.  Mild fatty infiltration of the liver.  No acute findings.   [TY]    Clinical Course User Index [TY] Coral Spikes, DO                                 Medical Decision Making Well-appearing 47 year old female presenting emergency department for right-sided abdominal pain.  Afebrile vital signs reassuring.  Physical exam showed some moderate tenderness on the right side of her abdomen.  Concern for biliary disease given location with right upper quadrant tenderness.  Ultrasound however negative  for acute cholecystitis.  She has no fever or tachycardia or tachycardia to recommend systemic infection.  She has no transaminitis suggest PAD biliary disease.  Lipase normal.  Pancreatitis unlikely.  UA consistent with UTI.  CT scan with no appendicitis and no acute pathology note explain patient's symptoms today.  Treated supportively with GI cocktail Pepcid and Zofran.  She was also given a 1 mg dose of morphine.  She reports that she is feeling better.  Will discharge with GI follow-up.  Amount and/or Complexity of Data Reviewed External Data Reviewed:     Details: Appears to have had several negative CT scans Labs: ordered. Decision-making details documented in ED Course. Radiology: ordered. Decision-making details  documented in ED Course.  Risk OTC drugs. Prescription drug management. Decision regarding hospitalization. Diagnosis or treatment significantly limited by social determinants of health. Risk Details: Nonnative English speaking          Final Clinical Impression(s) / ED Diagnoses Final diagnoses:  Right upper quadrant abdominal pain    Rx / DC Orders ED Discharge Orders          Ordered    pantoprazole (PROTONIX) 20 MG tablet  Daily        09/25/22 2114              Coral Spikes, DO 09/25/22 2233

## 2022-09-25 NOTE — ED Triage Notes (Signed)
Pt arrived via POV. C/o constant RUQ abd pain for 1x week.   AOx4

## 2022-10-20 ENCOUNTER — Other Ambulatory Visit: Payer: Self-pay

## 2022-10-20 ENCOUNTER — Encounter (HOSPITAL_COMMUNITY): Payer: Self-pay

## 2022-10-20 ENCOUNTER — Emergency Department (HOSPITAL_COMMUNITY)
Admission: EM | Admit: 2022-10-20 | Discharge: 2022-10-20 | Disposition: A | Discharge status: Home / Self Care | Payer: Medicaid Other | Attending: Emergency Medicine | Admitting: Emergency Medicine

## 2022-10-20 DIAGNOSIS — M546 Pain in thoracic spine: Secondary | ICD-10-CM | POA: Diagnosis not present

## 2022-10-20 DIAGNOSIS — M545 Low back pain, unspecified: Secondary | ICD-10-CM | POA: Insufficient documentation

## 2022-10-20 MED ORDER — KETOROLAC TROMETHAMINE 15 MG/ML IJ SOLN
15.0000 mg | Freq: Once | INTRAMUSCULAR | Status: AC
  Administered 2022-10-20: 15 mg via INTRAMUSCULAR
  Filled 2022-10-20: qty 1

## 2022-10-20 MED ORDER — LIDOCAINE 5 % EX PTCH
1.0000 | MEDICATED_PATCH | CUTANEOUS | Status: DC
  Administered 2022-10-20: 1 via TRANSDERMAL
  Filled 2022-10-20: qty 1

## 2022-10-20 MED ORDER — CYCLOBENZAPRINE HCL 10 MG PO TABS: 10.0000 mg | ORAL_TABLET | Freq: Two times a day (BID) | ORAL | 0 refills | Status: DC | PRN

## 2022-10-20 NOTE — ED Provider Notes (Signed)
[place] [facility]ERGENCY DEPARTMENT AT [facility] Provider Note   CSN: [id number] Arrival date & time: 10/20/22  1224     History No chief complaint on file.   Michelle Scott is a 47 y.o. female with medical history of anemia, UTI.  Patient presents to ED for evaluation of thoracic and low back pain.  Patient reports that 1 week ago she was moving apartments.  She reports that she had to move heavy dressers, mattresses down several flights of stairs.  She states that ever since this time she has had progressively worsening back pain located between her thoracic spine is in her lumbar spine.  States the pain is not focally located to her spine but instead present throughout entire back from thoracic area to lumbar area.  Denies trauma no cough or the pain.  Denies red flag symptoms of low back pain to include groin numbness, weakness of lower extremities, bowel or bladder incontinence.  States that she has been taking Tylenol at home with slight relief but is not fully alleviating pain.  Denies working or recent exertion.  Denies dysuria, flank pain, nausea, vomiting, fevers, chest pain, shortness of breath.  HPI     Home Medications Prior to Admission medications   Medication Sig Start Date End Date Taking? Authorizing Provider  cyclobenzaprine (FLEXERIL) 10 MG tablet Take 1 tablet (10 mg total) by mouth 2 (two) times daily as needed for muscle spasms. 10/20/22  Yes Al Decant, PA-C  acetaminophen (TYLENOL) 500 MG tablet Take 500 mg by mouth every 6 (six) hours as needed for mild pain.    [provider]  pantoprazole (PROTONIX) 20 MG tablet Take 1 tablet (20 mg total) by mouth daily for 14 days. 09/25/22 10/09/22  Coral Spikes, DO      Allergies    Gelatin    Review of Systems   Review of Systems  Musculoskeletal:  Positive for back pain.  All other systems reviewed and are negative.   Physical Exam Updated Vital Signs BP (!) 148/98   Pulse 82   Temp  98.2 F (36.8 C)   Resp 16   Ht 5\' 7"  (1.702 m)   Wt 74.8 kg   LMP 09/19/2022   SpO2 100%   BMI 25.83 kg/m  Physical Exam Vitals and nursing note reviewed.  HENT:     Head: Normocephalic and atraumatic.     Nose: Nose normal.     Mouth/Throat:     Mouth: Mucous membranes are dry.  Eyes:     Extraocular Movements: Extraocular movements intact.     Conjunctiva/sclera: Conjunctivae normal.     Pupils: Pupils are equal, round, and reactive to light.  Cardiovascular:     Rate and Rhythm: Normal rate and regular rhythm.  Pulmonary:     Effort: Pulmonary effort is normal.     Breath sounds: Normal breath sounds. No wheezing.  Abdominal:     General: Abdomen is flat.     Tenderness: There is no abdominal tenderness.  Musculoskeletal:     Cervical back: Normal range of motion and neck supple. No tenderness.       Back:     Comments: Patient reports pain present to this entire area.  Nonfocal tenderness, no centralized thoracic or lumbar spinal tenderness.  Full range of motion of lumbar spine appreciated.  Skin:    General: Skin is warm and dry.     Capillary Refill: Capillary refill takes less than 2 seconds.  Neurological:  General: No focal deficit present.     Mental Status: She is alert and oriented to person, place, and time.     GCS: GCS eye subscore is 4. GCS verbal subscore is 5. GCS motor subscore is 6.     Cranial Nerves: No cranial nerve deficit.     Sensory: Sensation is intact. No sensory deficit.     Motor: Motor function is intact. No weakness.     Coordination: Coordination is intact. Romberg sign negative.     Comments: 5 out of 5 strength bilateral lower extremities     ED Results / Procedures / Treatments   Labs (all labs ordered are listed, but only abnormal results are displayed) Labs Reviewed - No data to display  EKG None  Radiology No results found.  Procedures Procedures   Medications Ordered in ED Medications  lidocaine (LIDODERM) 5  % 1 patch (1 patch Transdermal Patch Applied 10/20/22 1357)  ketorolac (TORADOL) 15 MG/ML injection 15 mg (15 mg Intramuscular Given 10/20/22 1357)    ED Course/ Medical Decision Making/ A&P Clinical Course as of 10/20/22 1408  Thu Oct 20, 2022  1344 I have pain in back since yesterday. Pain is located from thoracic spine to lumbar spine. No work, no exercise. Has been moving recently, in the last 1 week. Thinks this is the cause. No CP, SOB. Took tylenol yesterday, did not help pain much.  [CG]    Clinical Course User Index [CG] Al Decant, PA-C   Medical Decision Making Risk Prescription drug management.   47 year old female presents to the ED for evaluation.  Please see HPI for further details.  On examination patient is afebrile and nontachycardic.  Lung sounds are clear bilaterally, not hypoxic.  Abdomen is soft and compressible throughout.  Neurological examination baseline, 5 out of 5 strength bilateral lower extremities.  Patient has nonfocal tenderness to her entire thoracic area to the lumbar area.  Nonfocal.  Denies urinary changes.  Triage note reports patient has pain radiating down her thigh however she denies this.  Patient reports the pain is been ongoing for the last 1 week however triage note reports pain suddenly came on yesterday.  Question patient regarding this however she denies this.  Unsure if this was a translation issue.  I did utilize a Nurse, learning disability and the patient states that the pain came on over the last 1 week is progressively worsened ever since moving 1 week ago.  At this time, patient was likely has musculoskeletal back pain as a result of moving recently.  Denies any neurologic symptoms or urinary symptoms.  Will provide Toradol shot and lidocaine patch.  Will advise her to continue taking ibuprofen or Tylenol and will prescribed muscle relaxant.  Will also prescribe patient Lidoderm patches and if these are too expensive we will advised her to  purchase Salonpas patches.  Will also refer patient to PCP for further management and care she states she does not have a PCP.  Return precautions provided and she voiced understanding.  All questions answered to the patient satisfaction.  Stable to discharge at this time.   Final Clinical Impression(s) / ED Diagnoses Final diagnoses:  Acute bilateral thoracic back pain  Lumbar back pain    Rx / DC Orders ED Discharge Orders          Ordered    cyclobenzaprine (FLEXERIL) 10 MG tablet  2 times daily PRN        10/20/22 1407  Al Decant, PA-C 10/20/22 1408    Linwood Dibbles, MD 10/21/22 [phone]

## 2022-10-20 NOTE — Discharge Instructions (Addendum)
It was a pleasure taking part in your care today.  As we discussed, I believe that your back pain is due to musculoskeletal strain.  Please begin taking Tylenol or ibuprofen every 6 hours as needed for pain.  Please begin taking muscle relaxers prescribed your pharmacy twice daily.  Please also purchase Salonpas patches and apply this to your low back for pain.  Please follow-up with PCP I prefer to do.  Please call and make an appointment to be seen.  If you develop any new or worsening symptoms please return to the ER for further management and care.

## 2022-10-20 NOTE — ED Triage Notes (Signed)
Pt arrive POV for back pain, mid, lower for one day. Reports pain came on suddenly yesterday. Radiates down thigh. Atraumatic. No urinary changes, afebrile. Denies numbness, tingling or any other symptoms.

## 2022-10-26 ENCOUNTER — Ambulatory Visit: Payer: Self-pay | Admitting: *Deleted

## 2022-10-26 NOTE — Telephone Encounter (Signed)
  Chief Complaint: Husband called in because his wife is still having bad back pain.   She was seen in ED on 10/20/2022 and told to see her PCP.   He needed the information for the clinic where she went before so he could make her an appt. Looking in her chart she is established with [facility].   So I gave him the number and name of the practice.  He thanked me for my help.   That is what he needed. Symptoms: wife still having back pain but needing a f/u visit with her PCP. Frequency: Still having back pain Pertinent Negatives: Patient denies knowing the name and number for the clinic. Disposition: [] ED /[] Urgent Care (no appt availability in office) / [] Appointment(In office/virtual)/ []  [facility]/ [] Home Care/ [] Refused Recommended Disposition /[] [facility]/ []  Follow-up with PCP Additional Notes: See notes

## 2022-10-26 NOTE — Telephone Encounter (Signed)
Reason for Disposition  [1] MODERATE back pain (e.g., interferes with normal activities) AND [2] present > 3 days    Seen in ED9/26/2024 for this same back pain  Answer Assessment - Initial Assessment Questions 1. ONSET: "When did the pain begin?"      She is having severe back pain.   Last week I took her to the ED and they did tests and said she needs to go to the family doctor.   She is needing to see a doctor.     2. LOCATION: "Where does it hurt?" (upper, mid or lower back)     Back Needs a new PCP appt.    3. SEVERITY: "How bad is the pain?"  (e.g., Scale 1-10; mild, moderate, or severe)   - MILD (1-3): Doesn't interfere with normal activities.    - MODERATE (4-7): Interferes with normal activities or awakens from sleep.    - SEVERE (8-10): Excruciating pain, unable to do any normal activities.      Severe back pain     Seen in ED 4. PATTERN: "Is the pain constant?" (e.g., yes, no; constant, intermittent)      Constant 5. RADIATION: "Does the pain shoot into your legs or somewhere else?"     Not asked since seen in ED 6. CAUSE:  "What do you think is causing the back pain?"      Don't know 7. BACK OVERUSE:  "Any recent lifting of heavy objects, strenuous work or exercise?"     Not asked 8. MEDICINES: "What have you taken so far for the pain?" (e.g., nothing, acetaminophen, NSAIDS)     Medicine from ED 9. NEUROLOGIC SYMPTOMS: "Do you have any weakness, numbness, or problems with bowel/bladder control?"     Not asked 10. OTHER SYMPTOMS: "Do you have any other symptoms?" (e.g., fever, abdomen pain, burning with urination, blood in urine)       Back pain 11. PREGNANCY: "Is there any chance you are pregnant?" "When was your last menstrual period?"       Not asked  Protocols used: Back Pain-A-AH

## 2022-11-18 ENCOUNTER — Ambulatory Visit (HOSPITAL_COMMUNITY)
Admission: RE | Admit: 2022-11-18 | Discharge: 2022-11-18 | Disposition: A | Discharge status: Home / Self Care | Payer: Medicaid Other | Source: Ambulatory Visit | Attending: Internal Medicine | Admitting: Internal Medicine

## 2022-11-18 ENCOUNTER — Encounter: Payer: Self-pay | Admitting: Student

## 2022-11-18 ENCOUNTER — Ambulatory Visit: Payer: Medicaid Other | Admitting: Student

## 2022-11-18 ENCOUNTER — Other Ambulatory Visit: Payer: Self-pay

## 2022-11-18 VITALS — BP 119/69 | HR 74 | Temp 98.0°F | Ht 65.0 in | Wt 176.6 lb

## 2022-11-18 DIAGNOSIS — D649 Anemia, unspecified: Secondary | ICD-10-CM | POA: Diagnosis not present

## 2022-11-18 DIAGNOSIS — Z3202 Encounter for pregnancy test, result negative: Secondary | ICD-10-CM

## 2022-11-18 DIAGNOSIS — M5442 Lumbago with sciatica, left side: Secondary | ICD-10-CM | POA: Diagnosis present

## 2022-11-18 DIAGNOSIS — G8929 Other chronic pain: Secondary | ICD-10-CM

## 2022-11-18 LAB — COMPREHENSIVE METABOLIC PANEL
ALT: 17 U/L (ref 0–44)
AST: 20 U/L (ref 15–41)
Albumin: 3.6 g/dL (ref 3.5–5.0)
Alkaline Phosphatase: 78 U/L (ref 38–126)
Anion gap: 7 (ref 5–15)
BUN: 9 mg/dL (ref 6–20)
CO2: 27 mmol/L (ref 22–32)
Calcium: 9.3 mg/dL (ref 8.9–10.3)
Chloride: 102 mmol/L (ref 98–111)
Creatinine, Ser: 0.65 mg/dL (ref 0.44–1.00)
GFR, Estimated: >60 mL/min (ref >60)
Glucose, Bld: 151 mg/dL — ABNORMAL HIGH (ref 70–99)
Potassium: 4 mmol/L (ref 3.5–5.1)
Sodium: 136 mmol/L (ref 135–145)
Total Bilirubin: 0.3 mg/dL (ref 0.3–1.2)
Total Protein: 7.4 g/dL (ref 6.5–8.1)

## 2022-11-18 LAB — POCT URINE PREGNANCY: Preg Test, Ur: NEGATIVE

## 2022-11-18 MED ORDER — METHOCARBAMOL 750 MG PO TABS: 1500.0000 mg | ORAL_TABLET | Freq: Three times a day (TID) | ORAL | 0 refills | Status: AC | PRN

## 2022-11-18 NOTE — Patient Instructions (Addendum)
Thank you, [name] for allowing [place] to provide your care today. Today we discussed your back pain.   I have ordered the following labs for you:  Lab Orders         IFE, PE and FLC, Serum         CBC no Diff         CMP w Anion Gap (STAT/Sunquest-performed on-site)         POCT Urine Pregnancy       Tests ordered today:  - X-ray of your lower back   Referrals ordered today:   Referral Orders  No referral(s) requested today     I have ordered the following medication/changed the following medications:   Stop the following medications: Medications Discontinued During This Encounter  Medication Reason   cyclobenzaprine (FLEXERIL) 10 MG tablet Patient has not taken in last 30 days     Start the following medications: Meds ordered this encounter  Medications   methocarbamol (ROBAXIN-750) 750 MG tablet    Sig: Take 2 tablets (1,500 mg total) by mouth every 8 (eight) hours as needed for muscle spasms.    Dispense:  84 tablet    Refill:  0     Follow up: 2 weeks    Remember:  - Robaxin is for your back pain, 2 tablets (1500 mg total) every 8 hours as needed  - You can still take Tylenol as needed, and use the patches  - You will get a call about your lab results and any next steps  - You will get a call to schedule your xray of your back   Should you have any questions or concerns please call the internal medicine clinic at [phone].     Michelle Fiumara Colbert Coyer, MD PGY-1 Internal Medicine Teaching Prog[facility]

## 2022-11-18 NOTE — Progress Notes (Signed)
Established Patient Office Visit  Subjective   Patient ID: Michelle Scott, female    DOB: 11-17-1975  Age: 47 y.o. MRN: [id number]  Chief Complaint  Patient presents with   Transitions Of Care     Ed f/u     Patient is a 47 yo with a past medical history stated below who presents today for ED follow-up for back pain. Arabic interpreter Christen Bame [phone] was available for virtual interpreting. She was last seen at [facility] on 11/29/2021 for chronic abdominal pain.   Of note, patient's husband was not present at the clinic visit. Requested follow up call after the appointment with patient's permission. This provider called and spoke with the patient's husband over the phone later in the day and helped to answer questions and address concerns.   Please see problem based assessment and plan for additional details.     Past Medical History:  Diagnosis Date   Anemia    Gestational diabetes    UTI (urinary tract infection)    Review of Systems  Musculoskeletal:  Positive for back pain. Negative for falls and neck pain.  Neurological:  Positive for focal weakness.      Objective:     BP 119/69 (BP Location: Left Arm, Cuff Size: Large)   Pulse 74   Temp 98 F (36.7 C) (Oral)   Ht 5\' 5"  (1.651 m)   Wt 176 lb 9.6 oz (80.1 kg)   LMP 09/19/2022   SpO2 100%   BMI 29.39 kg/m  BP Readings from Last 3 Encounters:  11/18/22 119/69  10/20/22 (!) 148/98  09/25/22 125/74   Wt Readings from Last 3 Encounters:  11/18/22 176 lb 9.6 oz (80.1 kg)  10/20/22 164 lb 14.5 oz (74.8 kg)  09/25/22 165 lb (74.8 kg)   Physical Exam  Results for orders placed or performed in visit on 11/18/22  IFE, PE and FLC, Serum  Result Value Ref Range   IgG (Immunoglobin G), Serum WILL FOLLOW    IgA/Immunoglobulin A, Serum WILL FOLLOW    IgM (Immunoglobulin M), Srm WILL FOLLOW    Total Protein WILL FOLLOW    Albumin SerPl Elph-Mcnc WILL FOLLOW    Alpha 1 WILL FOLLOW    Alpha2 Glob SerPl Elph-Mcnc WILL FOLLOW     B-Globulin SerPl Elph-Mcnc WILL FOLLOW    Gamma Glob SerPl Elph-Mcnc WILL FOLLOW    M Protein SerPl Elph-Mcnc WILL FOLLOW    Globulin, Total WILL FOLLOW    Albumin/Glob SerPl WILL FOLLOW    IFE 1 WILL FOLLOW    Please Note WILL FOLLOW    Ig Kappa Free Light Chain WILL FOLLOW    Ig Lambda Free Light Chain WILL FOLLOW    KAPPA/LAMBDA RATIO WILL FOLLOW   CBC no Diff  Result Value Ref Range   WBC 7.6 3.4 - 10.8 x10E3/uL   RBC 4.32 3.77 - 5.28 x10E6/uL   Hemoglobin 11.5 11.1 - 15.9 g/dL   Hematocrit 84.1 32.4 - 46.6 %   MCV 83 79 - 97 fL   MCH 26.6 26.6 - 33.0 pg   MCHC 32.0 31.5 - 35.7 g/dL   RDW 40.1 02.7 - 25.3 %   Platelets 367 150 - 450 x10E3/uL  CMP w Anion Gap (STAT/Sunquest-performed on-site)  Result Value Ref Range   Sodium 136 135 - 145 mmol/L   Potassium 4.0 3.5 - 5.1 mmol/L   Chloride 102 98 - 111 mmol/L   CO2 27 22 - 32 mmol/L   Glucose, Bld  151 (H) 70 - 99 mg/dL   BUN 9 6 - 20 mg/dL   Creatinine, Ser 9.14 0.44 - 1.00 mg/dL   Calcium 9.3 8.9 - 78.2 mg/dL   Total Protein 7.4 6.5 - 8.1 g/dL   Albumin 3.6 3.5 - 5.0 g/dL   AST 20 15 - 41 U/L   ALT 17 0 - 44 U/L   Alkaline Phosphatase 78 38 - 126 U/L   Total Bilirubin 0.3 0.3 - 1.2 mg/dL   GFR, Estimated >95 >62 mL/min   Anion gap 7 5 - 15  POCT Urine Pregnancy  Result Value Ref Range   Preg Test, Ur Negative Negative    Last metabolic panel Lab Results  Component Value Date   GLUCOSE 151 (H) 11/18/2022   NA 136 11/18/2022   K 4.0 11/18/2022   CL 102 11/18/2022   CO2 27 11/18/2022   BUN 9 11/18/2022   CREATININE 0.65 11/18/2022   GFRNONAA >60 11/18/2022   CALCIUM 9.3 11/18/2022   PROT WILL FOLLOW 11/18/2022   PROT 7.4 11/18/2022   ALBUMIN 3.6 11/18/2022   LABGLOB WILL FOLLOW 11/18/2022   BILITOT 0.3 11/18/2022   ALKPHOS 78 11/18/2022   AST 20 11/18/2022   ALT 17 11/18/2022   ANIONGAP 7 11/18/2022      The ASCVD Risk score (Arnett DK, et al., 2019) failed to calculate for the following  reasons:   Cannot find a previous HDL lab   Cannot find a previous total cholesterol lab    Assessment & Plan:   Problem List Items Addressed This Visit       Other   Anemia   Relevant Orders   CBC no Diff (Completed)   Other Visit Diagnoses     Midline low back pain with left-sided sciatica, unspecified chronicity    -  Primary   Relevant Medications   methocarbamol (ROBAXIN-750) 750 MG tablet   Other Relevant Orders   IFE, PE and FLC, Serum (Completed)   DG Lumbar Spine Complete (Completed)   POCT Urine Pregnancy (Completed)   CMP w Anion Gap (STAT/Sunquest-performed on-site) (Completed)      Return 2 weeks, for back pain, imaging follow up .  Patient seen with Dr. Antony Contras.   Kaili Castille Colbert Coyer, MD

## 2022-11-20 DIAGNOSIS — M545 Low back pain, unspecified: Secondary | ICD-10-CM | POA: Insufficient documentation

## 2022-11-20 DIAGNOSIS — G8929 Other chronic pain: Secondary | ICD-10-CM | POA: Insufficient documentation

## 2022-11-20 NOTE — Assessment & Plan Note (Addendum)
Patient seen in ED 10/20/22 for lower back pain. Given Toradol shot, lidocaine  patch, Tylenol 500 mg q6h for pain, and cyclobenzaprine 1 tablet 10 mg BID. Patient states she tried Tylenol and lidocaine patches with minimal benefit. Took a few doses of cyclobenzaprine and stopped since it didn't seem to help. Pain today was described as 8/10.   With the interpreter's help, patient helped clarify that lower back pain happened after she slipped in the shower about 4 weeks ago. Feels like she pulled something. Pain in lower back can radiate down to buttocks and left leg. Can be sharp sometimes. Denies red flag symptoms like saddle anesthesia, fecal or urine incontinence, or numbness. Also denies fever, weight loss, nausea, or vomiting. Does describe some weakness after standing for a while. Pain has been limiting her ability to get house work done. Concerned this is related to her chronic abdominal pain for which she has been worked up in the past. Clarified there can be some referred pain but that back pain is likely a separate issue.   On physical exam, patient experienced visible pain/discomfort when both legs were rotated externally. Exam was positive for left sided radiculopathy. Given that pain has not improved with conservative management, will obtain imaging and lab work to help rule out other causes of back pain such as infection and/or lytic lesions/MM. Will also discontinue cyclobenzaprine and order robaxin. Educated patient and patient's husband on importance of trying conservative management and pain management. Provided some reassurance that most cases of spinal radiculopathy are self-limited but given their concern and extended timing of symptoms will try to rule out other etiologies.  - STOP cyclobenxaprine, START robaxin 1500 mg total q8h as needed - Continue Tylenol 500 mg q6h for mild pain  - Continue lidocaine patches  - F/u blood dyscrasia labs, CBC - Ordered spinal xray, will follow up  with MRI as needed - Will see patient in 2 weeks for symptom follow up, determine if physical therapy may be helpful

## 2022-11-20 NOTE — Assessment & Plan Note (Signed)
Patient with history of normocytic anemia, Hgb ranging ~11-12. History of multiple vaginal births. No complaints of overt bleeding today. History of chronic abdominal pain, GI referral, and negative H pylori test November 2023. Today pain seems to be central/epigastric. Will obtain CBC and follow up as needed.

## 2022-11-21 NOTE — Progress Notes (Signed)
Internal Medicine Clinic Attending  I was physically present during the key portions of the resident provided service and participated in the medical decision making of patient's management care. I reviewed pertinent patient test results.  The assessment, diagnosis, and plan were formulated together and I agree with the documentation in the resident's note.  Reymundo Poll, MD

## 2022-11-23 LAB — IFE, PE AND FLC, SERUM
Albumin SerPl Elph-Mcnc: 3.7 g/dL (ref 2.9–4.4)
Albumin/Glob SerPl: 1 (ref 0.7–1.7)
Alpha 1: 0.2 g/dL (ref 0.0–0.4)
Alpha2 Glob SerPl Elph-Mcnc: 0.8 g/dL (ref 0.4–1.0)
B-Globulin SerPl Elph-Mcnc: 1.2 g/dL (ref 0.7–1.3)
Gamma Glob SerPl Elph-Mcnc: 1.7 g/dL (ref 0.4–1.8)
Globulin, Total: 3.8 g/dL (ref 2.2–3.9)
Ig Kappa Free Light Chain: 23.1 mg/L — ABNORMAL HIGH (ref 3.3–19.4)
Ig Lambda Free Light Chain: 19 mg/L (ref 5.7–26.3)
IgA/Immunoglobulin A, Serum: 208 mg/dL (ref 87–352)
IgG (Immunoglobin G), Serum: 1863 mg/dL — ABNORMAL HIGH (ref 586–1602)
IgM (Immunoglobulin M), Srm: 104 mg/dL (ref 26–217)
KAPPA/LAMBDA RATIO: 1.22 (ref 0.26–1.65)
Total Protein: 7.5 g/dL (ref 6.0–8.5)

## 2022-11-23 LAB — CBC
Hematocrit: 35.9 % (ref 34.0–46.6)
Hemoglobin: 11.5 g/dL (ref 11.1–15.9)
MCH: 26.6 pg (ref 26.6–33.0)
MCHC: 32 g/dL (ref 31.5–35.7)
MCV: 83 fL (ref 79–97)
Platelets: 367 10*3/uL (ref 150–450)
RBC: 4.32 x10E6/uL (ref 3.77–5.28)
RDW: 14.5 % (ref 11.7–15.4)
WBC: 7.6 10*3/uL (ref 3.4–10.8)

## 2022-11-25 ENCOUNTER — Encounter: Payer: Self-pay | Admitting: Student

## 2022-12-06 ENCOUNTER — Encounter: Payer: Medicaid Other | Admitting: Student

## 2023-12-11 ENCOUNTER — Encounter (HOSPITAL_COMMUNITY): Payer: Self-pay | Admitting: Emergency Medicine

## 2023-12-11 ENCOUNTER — Emergency Department (HOSPITAL_COMMUNITY): Admission: EM | Admit: 2023-12-11 | Discharge: 2023-12-11 | Disposition: A | Discharge status: Home / Self Care

## 2023-12-11 ENCOUNTER — Other Ambulatory Visit: Payer: Self-pay

## 2023-12-11 ENCOUNTER — Emergency Department (HOSPITAL_COMMUNITY)

## 2023-12-11 DIAGNOSIS — R1011 Right upper quadrant pain: Secondary | ICD-10-CM | POA: Diagnosis present

## 2023-12-11 DIAGNOSIS — K2970 Gastritis, unspecified, without bleeding: Secondary | ICD-10-CM | POA: Diagnosis not present

## 2023-12-11 DIAGNOSIS — R109 Unspecified abdominal pain: Secondary | ICD-10-CM

## 2023-12-11 LAB — CBC
HCT: 35.7 % — ABNORMAL LOW (ref 36.0–46.0)
Hemoglobin: 11.7 g/dL — ABNORMAL LOW (ref 12.0–15.0)
MCH: 28.5 pg (ref 26.0–34.0)
MCHC: 32.8 g/dL (ref 30.0–36.0)
MCV: 86.9 fL (ref 80.0–100.0)
Platelets: 387 K/uL (ref 150–400)
RBC: 4.11 MIL/uL (ref 3.87–5.11)
RDW: 15.3 % (ref 11.5–15.5)
WBC: 7 K/uL (ref 4.0–10.5)
nRBC: 0 % (ref 0.0–0.2)

## 2023-12-11 LAB — COMPREHENSIVE METABOLIC PANEL WITH GFR
ALT: 22 U/L (ref 0–44)
AST: 39 U/L (ref 15–41)
Albumin: 4.4 g/dL (ref 3.5–5.0)
Alkaline Phosphatase: 63 U/L (ref 38–126)
Anion gap: 9 (ref 5–15)
BUN: 15 mg/dL (ref 6–20)
CO2: 26 mmol/L (ref 22–32)
Calcium: 9.6 mg/dL (ref 8.9–10.3)
Chloride: 103 mmol/L (ref 98–111)
Creatinine, Ser: 0.74 mg/dL (ref 0.44–1.00)
GFR, Estimated: >60 mL/min (ref >60)
Glucose, Bld: 89 mg/dL (ref 70–99)
Potassium: 4 mmol/L (ref 3.5–5.1)
Sodium: 138 mmol/L (ref 135–145)
Total Bilirubin: 0.4 mg/dL (ref 0.0–1.2)
Total Protein: 8.1 g/dL (ref 6.5–8.1)

## 2023-12-11 LAB — URINALYSIS, ROUTINE W REFLEX MICROSCOPIC
Bilirubin Urine: NEGATIVE
Glucose, UA: NEGATIVE mg/dL
Hgb urine dipstick: NEGATIVE
Ketones, ur: 5 mg/dL — AB
Leukocytes,Ua: NEGATIVE
Nitrite: NEGATIVE
Protein, ur: NEGATIVE mg/dL
Specific Gravity, Urine: 1.025 (ref 1.005–1.030)
pH: 5 (ref 5.0–8.0)

## 2023-12-11 LAB — LIPASE, BLOOD: Lipase: 39 U/L (ref 11–51)

## 2023-12-11 MED ORDER — SUCRALFATE 1 G PO TABS: 1.0000 g | ORAL_TABLET | Freq: Three times a day (TID) | ORAL | 0 refills | Status: DC

## 2023-12-11 MED ORDER — PANTOPRAZOLE SODIUM 20 MG PO TBEC: 40.0000 mg | DELAYED_RELEASE_TABLET | Freq: Every day | ORAL | 1 refills | Status: AC

## 2023-12-11 MED ORDER — PANTOPRAZOLE SODIUM 20 MG PO TBEC: 40.0000 mg | DELAYED_RELEASE_TABLET | Freq: Every day | ORAL | 1 refills | Status: DC

## 2023-12-11 MED ORDER — SUCRALFATE 1 G PO TABS: 1.0000 g | ORAL_TABLET | Freq: Three times a day (TID) | ORAL | 0 refills | Status: AC

## 2023-12-11 NOTE — Discharge Instructions (Signed)
 Take your Protonix  and Carafate  as prescribed.  Call the office phone number below to make a follow-up appointment.  They received a referral for you previously.

## 2023-12-11 NOTE — ED Triage Notes (Signed)
 Patient c/o RUQ abdominal pain x 1 month. Patient report pain radiates to her right lower back. Patient denies N/V. Patient denies dysuria.

## 2023-12-11 NOTE — ED Provider Notes (Signed)
 [place] EMERGENCY DEPARTMENT AT [facility] Provider Note   CSN: [id number] Arrival date & time: 12/11/23  1110     Patient presents with: Abdominal Pain   Michelle Scott is a 48 y.o. female.   48 year old Arabic speaking female presents for evaluation of right upper quadrant abdominal pain.  This has been going on off and on for a few years.  States it does not seem to be related to food, but sometimes radiates to her back and sometimes it is in the epigastric region.  Denies heartburn. She was supposed to follow up with GI in the past and never made an appointment.    Abdominal Pain Associated symptoms: no chest pain, no chills, no cough, no dysuria, no fever, no hematuria, no shortness of breath, no sore throat and no vomiting        Prior to Admission medications   Medication Sig Start Date End Date Taking? Authorizing Provider  pantoprazole  (PROTONIX ) 20 MG tablet Take 2 tablets (40 mg total) by mouth daily. 12/11/23 01/20/24 Yes Tory Septer L, DO  sucralfate  (CARAFATE ) 1 g tablet Take 1 tablet (1 g total) by mouth 4 (four) times daily -  with meals and at bedtime for 14 days. 12/11/23 12/25/23 Yes Jaslynne Dahan L, DO  acetaminophen  (TYLENOL ) 500 MG tablet Take 500 mg by mouth every 6 (six) hours as needed for mild pain.    [provider]  methocarbamol  (ROBAXIN -750) 750 MG tablet Take 2 tablets (1,500 mg total) by mouth every 8 (eight) hours as needed for muscle spasms. 11/18/22   Arellano Zameza, Priscila, MD  pantoprazole  (PROTONIX ) 20 MG tablet Take 1 tablet (20 mg total) by mouth daily for 14 days. 09/25/22 10/09/22  Neysa Caron PARAS, DO    Allergies: Gelatin    Review of Systems  Constitutional:  Negative for chills and fever.  HENT:  Negative for ear pain and sore throat.   Eyes:  Negative for pain and visual disturbance.  Respiratory:  Negative for cough and shortness of breath.   Cardiovascular:  Negative for chest pain and palpitations.   Gastrointestinal:  Positive for abdominal pain. Negative for vomiting.  Genitourinary:  Negative for dysuria and hematuria.  Musculoskeletal:  Negative for arthralgias and back pain.  Skin:  Negative for color change and rash.  Neurological:  Negative for seizures and syncope.  All other systems reviewed and are negative.   Updated Vital Signs BP 109/73 (BP Location: Left Arm)   Pulse 63   Temp 98.1 F (36.7 C) (Oral)   Resp 16   SpO2 100%   Physical Exam Vitals and nursing note reviewed.  Constitutional:      General: She is not in acute distress.    Appearance: She is well-developed. She is not ill-appearing.  HENT:     Head: Normocephalic and atraumatic.  Eyes:     Conjunctiva/sclera: Conjunctivae normal.  Cardiovascular:     Rate and Rhythm: Normal rate and regular rhythm.     Heart sounds: No murmur heard. Pulmonary:     Effort: Pulmonary effort is normal. No respiratory distress.     Breath sounds: Normal breath sounds.  Abdominal:     Palpations: Abdomen is soft.     Tenderness: There is abdominal tenderness in the right upper quadrant.  Musculoskeletal:        General: No swelling.     Cervical back: Neck supple.  Skin:    General: Skin is warm and dry.  Capillary Refill: Capillary refill takes less than 2 seconds.  Neurological:     Mental Status: She is alert.  Psychiatric:        Mood and Affect: Mood normal.     (all labs ordered are listed, but only abnormal results are displayed) Labs Reviewed  CBC - Abnormal; Notable for the following components:      Result Value   Hemoglobin 11.7 (*)    HCT 35.7 (*)    All other components within normal limits  URINALYSIS, ROUTINE W REFLEX MICROSCOPIC - Abnormal; Notable for the following components:   APPearance TURBID (*)    Ketones, ur 5 (*)    Bacteria, UA RARE (*)    All other components within normal limits  LIPASE, BLOOD  COMPREHENSIVE METABOLIC PANEL WITH GFR    EKG: None  Radiology: US   Abdomen Limited RUQ (LIVER/GB) Result Date: 12/11/2023 CLINICAL DATA:  [id number] RUQ abdominal pain [id number] EXAM: ULTRASOUND ABDOMEN LIMITED RIGHT UPPER QUADRANT COMPARISON:  September 25, 2022 FINDINGS: Gallbladder: No gallstones or wall thickening visualized. No sonographic Murphy sign noted by sonographer. Common bile duct: Diameter: Visualized portion measures 2 mm, within normal limits. The majority is not visualized. Liver: No focal lesion identified. Heterogeneous and overall increased in parenchymal echogenicity. Mildly lobular contours. Portal vein is patent on color Doppler imaging with normal direction of blood flow towards the liver. Other: None. IMPRESSION: 1. No sonographic etiology for right upper quadrant pain identified. 2. Heterogeneous and increased hepatic echogenicity as can be seen in hepatic steatosis. Mildly lobular contours may reflect a degree of underlying fibrosis. Electronically Signed   By: Corean Salter M.D.   On: 12/11/2023 14:11     Procedures   Medications Ordered in the ED - No data to display                                  Medical Decision Making Social determinants of health: Patient is Arabic speaking only  Arabic speaking female presents for evaluation of right upper quadrant abdominal pain that has been ongoing.  Interpretation provided by Arabic speaking provider in the emergency department.  Results reviewed by me and show normal lab work and normal right upper quadrant ultrasound.  I think she may have some element of GERD or gastritis.  Will start her on Protonix  and Carafate  and she is advised to closely follow-up with GI.  I did give her the phone number that was listed in computer as the office in which they had received a previous referral.  Advised to call and make an appointment otherwise return for new or worsening symptoms.  She feels comfortable with the plan to be discharged home.    Problems Addressed: Abdominal pain, non-surgical:  undiagnosed new problem with uncertain prognosis Gastritis without bleeding, unspecified chronicity, unspecified gastritis type: acute illness or injury  Amount and/or Complexity of Data Reviewed External Data Reviewed: notes.    Details: Prior outpatient records reviewed in GI offices.  Patient never scheduled an appointment, she has been seen in the ER previously multiple times for this Labs: ordered. Decision-making details documented in ED Course.    Details: Ordered and Reviewed by me and unremarkable Radiology: ordered and independent interpretation performed. Decision-making details documented in ED Course.    Details: Ordered And reviewed by me Right upper quadrant ultrasound: Shows no acute abnormality or explanation for patient's symptoms  Risk OTC drugs. Prescription drug management. Diagnosis  or treatment significantly limited by social determinants of health.     Final diagnoses:  Abdominal pain, non-surgical  Gastritis without bleeding, unspecified chronicity, unspecified gastritis type    ED Discharge Orders          Ordered    sucralfate  (CARAFATE ) 1 g tablet  3 times daily with meals & bedtime        12/11/23 1607    pantoprazole  (PROTONIX ) 20 MG tablet  Daily        12/11/23 1607               Khia Dieterich, Duwaine L, DO 12/11/23 1611

## 2024-01-23 ENCOUNTER — Other Ambulatory Visit: Payer: Self-pay | Admitting: Nurse Practitioner

## 2024-01-23 DIAGNOSIS — Z1231 Encounter for screening mammogram for malignant neoplasm of breast: Secondary | ICD-10-CM
